# Patient Record
Sex: Female | Born: 1985 | Race: White | Hispanic: No | Marital: Married | Smoking: Former smoker
Health system: Southern US, Community
[De-identification: ages and names within clinical notes are randomized; demographics above are authoritative.]

## PROBLEM LIST (undated history)

## (undated) DIAGNOSIS — F445 Conversion disorder with seizures or convulsions: Secondary | ICD-10-CM

## (undated) DIAGNOSIS — G8929 Other chronic pain: Secondary | ICD-10-CM

## (undated) DIAGNOSIS — R51 Headache: Secondary | ICD-10-CM

## (undated) DIAGNOSIS — Z8632 Personal history of gestational diabetes: Secondary | ICD-10-CM

## (undated) DIAGNOSIS — R519 Headache, unspecified: Secondary | ICD-10-CM

## (undated) DIAGNOSIS — F319 Bipolar disorder, unspecified: Secondary | ICD-10-CM

## (undated) HISTORY — PX: TUBAL LIGATION: SHX77

---
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
## 2011-01-08 ENCOUNTER — Ambulatory Visit: Payer: Self-pay | Admitting: Internal Medicine

## 2011-01-23 ENCOUNTER — Emergency Department: Payer: Self-pay | Admitting: Emergency Medicine

## 2011-02-08 ENCOUNTER — Emergency Department: Payer: Self-pay | Admitting: Emergency Medicine

## 2011-05-15 ENCOUNTER — Observation Stay: Payer: Self-pay | Admitting: Internal Medicine

## 2011-07-02 ENCOUNTER — Observation Stay: Payer: Self-pay

## 2011-12-13 ENCOUNTER — Emergency Department: Payer: Self-pay | Admitting: Emergency Medicine

## 2012-04-08 ENCOUNTER — Ambulatory Visit: Payer: Self-pay

## 2012-11-01 ENCOUNTER — Emergency Department: Payer: Self-pay | Admitting: Emergency Medicine

## 2012-11-01 LAB — URINALYSIS, COMPLETE
Bilirubin,UR: NEGATIVE
Glucose,UR: NEGATIVE mg/dL (ref 0–75)
Ketone: NEGATIVE
Leukocyte Esterase: NEGATIVE
Nitrite: NEGATIVE
Ph: 5 (ref 4.5–8.0)
Protein: NEGATIVE
RBC,UR: 1 /HPF (ref 0–5)
Specific Gravity: 1.024 (ref 1.003–1.030)
Squamous Epithelial: 4
WBC UR: 1 /HPF (ref 0–5)

## 2012-11-01 LAB — BASIC METABOLIC PANEL
Anion Gap: 8 (ref 7–16)
BUN: 8 mg/dL (ref 7–18)
Calcium, Total: 9.2 mg/dL (ref 8.5–10.1)
Chloride: 109 mmol/L — ABNORMAL HIGH (ref 98–107)
Co2: 25 mmol/L (ref 21–32)
Creatinine: 0.85 mg/dL (ref 0.60–1.30)
EGFR (African American): >60
EGFR (Non-African Amer.): >60
Glucose: 102 mg/dL — ABNORMAL HIGH (ref 65–99)
Osmolality: 282 (ref 275–301)
Potassium: 3.9 mmol/L (ref 3.5–5.1)
Sodium: 142 mmol/L (ref 136–145)

## 2012-11-01 LAB — CBC
HCT: 42.4 % (ref 35.0–47.0)
HGB: 15 g/dL (ref 12.0–16.0)
MCH: 30.9 pg (ref 26.0–34.0)
MCHC: 35.4 g/dL (ref 32.0–36.0)
MCV: 87 fL (ref 80–100)
Platelet: 244 10*3/uL (ref 150–440)
RBC: 4.86 10*6/uL (ref 3.80–5.20)
RDW: 12.9 % (ref 11.5–14.5)
WBC: 7 10*3/uL (ref 3.6–11.0)

## 2012-11-03 LAB — URINE CULTURE

## 2013-06-04 ENCOUNTER — Emergency Department: Payer: Self-pay | Admitting: Emergency Medicine

## 2014-10-06 ENCOUNTER — Emergency Department: Payer: Self-pay | Admitting: Emergency Medicine

## 2014-10-06 LAB — COMPREHENSIVE METABOLIC PANEL
Albumin: 4 g/dL (ref 3.4–5.0)
Alkaline Phosphatase: 103 U/L
Anion Gap: 0 — ABNORMAL LOW (ref 7–16)
BUN: 11 mg/dL (ref 7–18)
Bilirubin,Total: 0.6 mg/dL (ref 0.2–1.0)
Calcium, Total: 8.4 mg/dL — ABNORMAL LOW (ref 8.5–10.1)
Chloride: 111 mmol/L — ABNORMAL HIGH (ref 98–107)
Co2: 27 mmol/L (ref 21–32)
Creatinine: 0.78 mg/dL (ref 0.60–1.30)
EGFR (African American): >60
EGFR (Non-African Amer.): >60
Glucose: 110 mg/dL — ABNORMAL HIGH (ref 65–99)
Osmolality: 276 (ref 275–301)
Potassium: 3.6 mmol/L (ref 3.5–5.1)
SGOT(AST): 30 U/L (ref 15–37)
SGPT (ALT): 51 U/L
Sodium: 138 mmol/L (ref 136–145)
Total Protein: 7.5 g/dL (ref 6.4–8.2)

## 2014-10-06 LAB — CBC
HCT: 45.6 % (ref 35.0–47.0)
HGB: 15.1 g/dL (ref 12.0–16.0)
MCH: 29.6 pg (ref 26.0–34.0)
MCHC: 33.1 g/dL (ref 32.0–36.0)
MCV: 89 fL (ref 80–100)
Platelet: 197 10*3/uL (ref 150–440)
RBC: 5.11 10*6/uL (ref 3.80–5.20)
RDW: 12.8 % (ref 11.5–14.5)
WBC: 8.3 10*3/uL (ref 3.6–11.0)

## 2014-10-06 LAB — URINALYSIS, COMPLETE
Bacteria: NONE SEEN
Bilirubin,UR: NEGATIVE
Blood: NEGATIVE
Glucose,UR: NEGATIVE mg/dL (ref 0–75)
Ketone: NEGATIVE
Leukocyte Esterase: NEGATIVE
Nitrite: NEGATIVE
Ph: 5 (ref 4.5–8.0)
Protein: NEGATIVE
RBC,UR: 11 /HPF (ref 0–5)
Specific Gravity: 1.032 (ref 1.003–1.030)
Squamous Epithelial: 23
WBC UR: 3 /HPF (ref 0–5)

## 2014-10-06 LAB — PREGNANCY, URINE: Pregnancy Test, Urine: NEGATIVE m[IU]/mL

## 2014-10-07 LAB — URINE CULTURE

## 2014-12-09 ENCOUNTER — Emergency Department: Payer: Self-pay | Admitting: Emergency Medicine

## 2014-12-09 LAB — URINALYSIS, COMPLETE
Bacteria: NONE SEEN
Bilirubin,UR: NEGATIVE
Blood: NEGATIVE
Glucose,UR: NEGATIVE mg/dL (ref 0–75)
Ketone: NEGATIVE
Leukocyte Esterase: NEGATIVE
Nitrite: NEGATIVE
Ph: 6 (ref 4.5–8.0)
Protein: NEGATIVE
RBC,UR: NONE SEEN /HPF (ref 0–5)
Specific Gravity: 1.021 (ref 1.003–1.030)
Squamous Epithelial: 3
WBC UR: <1 /HPF (ref 0–5)

## 2014-12-09 LAB — INFLUENZA A,B,H1N1 - PCR (ARMC)
H1N1 flu by pcr: NOT DETECTED
Influenza A By PCR: NEGATIVE
Influenza B By PCR: NEGATIVE

## 2014-12-26 ENCOUNTER — Emergency Department: Payer: Self-pay | Admitting: Emergency Medicine

## 2014-12-26 LAB — CBC WITH DIFFERENTIAL/PLATELET
Basophil #: 0.1 10*3/uL (ref 0.0–0.1)
Basophil %: 0.6 %
Eosinophil #: 0.1 10*3/uL (ref 0.0–0.7)
Eosinophil %: 1.5 %
HCT: 39.8 % (ref 35.0–47.0)
HGB: 13.3 g/dL (ref 12.0–16.0)
Lymphocyte #: 2.9 10*3/uL (ref 1.0–3.6)
Lymphocyte %: 35.8 %
MCH: 30.1 pg (ref 26.0–34.0)
MCHC: 33.5 g/dL (ref 32.0–36.0)
MCV: 90 fL (ref 80–100)
Monocyte #: 0.6 x10 3/mm (ref 0.2–0.9)
Monocyte %: 6.7 %
Neutrophil #: 4.6 10*3/uL (ref 1.4–6.5)
Neutrophil %: 55.4 %
Platelet: 232 10*3/uL (ref 150–440)
RBC: 4.43 10*6/uL (ref 3.80–5.20)
RDW: 12.7 % (ref 11.5–14.5)
WBC: 8.2 10*3/uL (ref 3.6–11.0)

## 2014-12-26 LAB — COMPREHENSIVE METABOLIC PANEL
Albumin: 3.8 g/dL (ref 3.4–5.0)
Alkaline Phosphatase: 84 U/L
Anion Gap: 5 — ABNORMAL LOW (ref 7–16)
BUN: 8 mg/dL (ref 7–18)
Bilirubin,Total: 0.2 mg/dL (ref 0.2–1.0)
Calcium, Total: 8.5 mg/dL (ref 8.5–10.1)
Chloride: 107 mmol/L (ref 98–107)
Co2: 26 mmol/L (ref 21–32)
Creatinine: 0.85 mg/dL (ref 0.60–1.30)
EGFR (African American): >60
EGFR (Non-African Amer.): >60
Glucose: 112 mg/dL — ABNORMAL HIGH (ref 65–99)
Osmolality: 275 (ref 275–301)
Potassium: 3.9 mmol/L (ref 3.5–5.1)
SGOT(AST): 16 U/L (ref 15–37)
SGPT (ALT): 25 U/L
Sodium: 138 mmol/L (ref 136–145)
Total Protein: 7.2 g/dL (ref 6.4–8.2)

## 2014-12-26 LAB — URINALYSIS, COMPLETE
Bacteria: NONE SEEN
Bilirubin,UR: NEGATIVE
Blood: NEGATIVE
Glucose,UR: NEGATIVE mg/dL (ref 0–75)
Ketone: NEGATIVE
Leukocyte Esterase: NEGATIVE
Nitrite: NEGATIVE
Ph: 6 (ref 4.5–8.0)
Protein: NEGATIVE
RBC,UR: <1 /HPF (ref 0–5)
Specific Gravity: 1.004 (ref 1.003–1.030)
Squamous Epithelial: 6
WBC UR: 2 /HPF (ref 0–5)

## 2014-12-26 LAB — HCG, QUANTITATIVE, PREGNANCY: Beta Hcg, Quant.: 120 m[IU]/mL — ABNORMAL HIGH

## 2015-02-21 ENCOUNTER — Observation Stay: Payer: Self-pay | Admitting: Internal Medicine

## 2015-02-22 ENCOUNTER — Ambulatory Visit: Payer: Self-pay | Admitting: Neurology

## 2015-02-24 ENCOUNTER — Emergency Department: Payer: Self-pay | Admitting: Emergency Medicine

## 2015-04-25 NOTE — Consult Note (Signed)
Present Illness Pt with no cardiac history but history of a syncope times two over the past 24 hours. She is [redacted] weeks pregnant with her third child. She was bowling with her family and felt very hot and lightheaded. She went outside and felt somewhat better. She came back in and relt lightheaded again followed by a syncopal episode. She was felt to not have a pulse by bystanders. Her husband brought her to the hospital and she aparently had another syncopal episode in the car on the way. In the er her ekg was normal. Her labs were normal. She had an echo which revealed normal lv fucntion with no valvular abnormalities. She is hemodynamically stable. WHile in the er, she was being prepared for discharge when she was noted to have a pulse ox in the 70s with associated chest heaviness. No report of arrythmia with this event. She has not been noted ot be hypoxic in the past. Since admission she is stable with no dizziness or syncope. No eviidence of arrythmia. She had preeclampsia with her first child and gestational diabetes with her second. No arrythmia   Physical Exam:  GEN no acute distress   HEENT PERRL   NECK supple   RESP normal resp effort  no use of accessory muscles   CARD Normal, S1, S2   ABD denies tenderness  normal BS  no Adominal Mass   LYMPH negative neck, negative axillae   EXTR negative cyanosis/clubbing, negative edema   SKIN normal to palpation, No rashes   NEURO cranial nerves intact, motor/sensory function intact   PSYCH A+O to time, place, person   Review of Systems:  Subjective/Chief Complaint two episodes of syncope   General: No Complaints   Skin: No Complaints   ENT: No Complaints   Eyes: No Complaints   Neck: No Complaints   Respiratory: No Complaints   Cardiovascular: No Complaints   Gastrointestinal: No Complaints   Genitourinary: No Complaints   Vascular: No Complaints   Musculoskeletal: No Complaints   Neurologic: Fainting    Hematologic: No Complaints   Endocrine: No Complaints   Psychiatric: No Complaints   Review of Systems: All other systems were reviewed and found to be negative   Medications/Allergies Reviewed Medications/Allergies reviewed   Family & Social History:  Family and Social History:  Family History Non-Contributory   Social History negative tobacco   Place of Living Home   EKG:  EKG NSR    Hydrocodone: Rash  Latex: Hives   Impression Female with no cardiac history admitted after suffering two syncoopal episodes. SHe is [redacted] weeks pregnant with her third child. Had preecclampsia with her first child and gesttionl diabetes with a second No synope with these births and the children are normal. The episodes had features consisant with vasovagal syncope. There was an episode in the er with apparent hypoxia noted on pulse ox. No arrythmia reported at the time. Etiology of this is unclear. Pulse ox not reading well vs true hypoxia. Echo did not reveal any signficant abnormalities. Will need aggitated saline contrast echo to evaluate for pfo. Will follow for arrythmia on telemetry and follow electrolytes and porceed with contrast echo in am. CXR was normal as was carotid doppler   Plan 1. Hydration 2 Follow on tele 3. Agitate saline contast echo in am 4. Follow electrolytes   Electronic Signatures: Dalia HeadingFath, Kert Shackett A (MD)  (Signed 28-Feb-16 17:20)  Authored: General Aspect/Present Illness, History and Physical Exam, Review of System, Family & Social History,  EKG , Allergies, Impression/Plan   Last Updated: 28-Feb-16 17:20 by Dalia Heading (MD)

## 2015-04-25 NOTE — Consult Note (Signed)
PATIENT NAME:  Salvatore DecentBAKER DAVIS, Ronie M MR#:  [id number] DATE OF BIRTH:  1986-04-23  DATE OF CONSULTATION:  02/22/2015  REFERRING PHYSICIAN:   CONSULTING PHYSICIAN:  Pauletta BrownsYuriy Gennaro Lizotte, MD  REASON FOR CONSULTATION: Syncopal event.   HISTORY OF PRESENT ILLNESS: This is a 29 year old female with pregnancy. She is currently 3713 weeks gestational age. Presented with multiple syncopal episodes, described as x2. The patient states prior to the episodes the patient felt very hot and lightheaded. That was during a bowling episode. The second syncopal episode occurred while the patient was in the vehicle driving. No seizure like activity. No tongue biting. No urinary incontinence. No history of seizures in the past. No family history of seizures. The patient currently states she is still dizzy. On current blood pressure checks, the patient's blood pressure is 90 systolic.   HOME MEDICATIONS: Reviewed.   IMAGING: Reviewed.   DIAGNOSTIC DATA: Laboratory workup has been reviewed.   NEUROLOGIC EXAMINATION: The patient states she is dizzy. Extraocular movements intact. Facial sensation intact. Facial motor is intact. Tongue is in the midline. Uvula elevates symmetrically. Shoulder shrug intact. Motor 5/5 in bilateral upper and lower extremities. Coordination: Finger-to-nose intact. Sensation intact. Coordination of finger-to-nose intact.   REVIEW OF SYSTEMS: No shortness of breath. Positive chest pain that has resolved. No weakness on one side of the body compared to the other. Positive history for syncope. Pregnancy positive history.  ASSESSMENT: A 29 year old female, who is [redacted] weeks pregnant with her third child, with apparent history of eclampsia in the past, presents with multiple syncopal episodes, shortness of breath, now complains of dizziness. Work-up underway.   PLAN: Continue telemetry. Appreciate cardiology evaluation with echocardiogram and bubble study to be performed in the a.m. Hydration. No further  imaging from a neurological standpoint. This is not seizure activity. No need for EEG monitoring. Please call with any questions.   ____________________________ Pauletta BrownsYuriy Ferry Matthis, MD yz:sb D: 02/22/2015 10:14:36 ET T: 02/22/2015 11:03:32 ET JOB#: [id number]  cc: Pauletta BrownsYuriy Blyss Lugar, MD, <Dictator> Pauletta BrownsYURIY Jakeline Dave MD ELECTRONICALLY SIGNED 03/11/2015 12:09

## 2015-04-25 NOTE — Discharge Summary (Signed)
PATIENT NAME:  Heidi Hudson, Heidi Hudson MR#:  [id number] DATE OF BIRTH:  January 01, 1986  DATE OF ADMISSION:  02/21/2015 DATE OF DISCHARGE:  02/22/2015  ADMITTING DIAGNOSIS: Syncope.   DISCHARGE DIAGNOSES: 1. Syncope versus presyncope, suspected orthostatic hypotension related.  2. Orthostatic hypotension.  3. Dehydration.  4. Dizziness due to orthostatic hypotension.  5. Hyperglycemia with hemoglobin A1c 5.1. No diabetes.  6. Hypoxia, intermittent, resolved, questionable obstructive sleep apnea. Sleep study was recommended.  7. Hypokalemia, resolved.  8. Urinary tract infection, Escherichia coli per report from [facility].  9. A 13 week pregnancy. Per report from confirmed Doppler, fetal heart rate of 152.  MEDICATIONS: Meclizine 12.5 mg p.o. every 8 hours as needed. Keflex 500 mg every 8 hours, started on the 29th of February 2016.   HOME OXYGEN: None.   DIET: Regular, regular consistency. The patient was advised to drink plenty of fluids to hydrate herself.   FOLLOWUP: Follow-up appointment at [facility] with Ermalene Searing on Thursday, in 3 days at 2:00 p.m. in her office.   CONSULTANTS: Care management, social work, neurologist Dr. Loretha Brasil, cardiologist Dr. Lady Gary, GYN specialist Dr. Thomasene Mohair.   RADIOLOGIC STUDIES: Chest x-ray portable single view, on the 28th of Febreuary 2016 showed no active disease. CTA of chest, withIV contrast to rule out pulmonary embolism on 28th of February 2016 revealing no pulmonary embolus or acute intrathoracic process. Carotid ultrasound 28th of February 2016 showing normal bilateral carotid duplex ultrasound with normal antegrade flow in the right as well as left vertebral arteries. Echocardiogram 28th of February 2016 showed left ventricular ejection fraction by visual estimation 60% -65%, normal global left ventricular systolic function. Echocardiogram was agitated saline contrast. Bubble study showed left ventricular ejection fraction by visual estimation 60% -65%.  Saline contrast bubble study was negative with no evidence of any intracardiac shunt,  intact intra-atrial as well as intraventricular septa. No echocardiographic evidence of intracardiac shunting.   HISTORY OF PRESENT ILLNESS: The patient 29 year old Caucasian female with past medical history significant for 2 pregnancies with complications which resulted in life and healthy children, approximately 6, as well as 3 years ago, who presents to the hospital with episode of syncope. Please refer to Dr. Elias Else admission note and 28th of February 2016. On arrival to the hospital, the patient stated that she was bowling with her husband and friends and she felt weak. She sat down since she felt as if she was going to pass out. She had some nausea. She was able to gather strength and was able to walk to the car and apparently did not lose consciousness while walking. However, on the way home, she appeared to lose consciousness. Apparently  of her companions checked for pulses as well as respirations and they could not detect either, which prompted them to bring her to the Emergency Room. In the Emergency Room, she was very somnolent, difficult to arouse, but did awaken to sternal rub and she had initial altered mental status. Later on she appeared to be more alert, but she did have desaturations to 80% on room air when she got up to go to the commode. She also apparently, according to family members, had episodes of low blood pressure in the Emergency Room. On arrival to the Emergency Room documented vital signs, temperature was 98, pulse was 74, respirations was 18, blood pressure 102/66, saturation was 94% on room air.   PHYSICAL EXAM: Unremarkable.   LABORATORY DATA: Done on arrival to the Emergency Room showed mildly elevated glucose to  114; otherwise BMP was unremarkable. The patient's hemoglobin A1c was checked and was found to be 5.1, lipase 158, magnesium 2.1. Alcohol level less than 3. HCG beta subunit was  43,969. The patient's liver enzymes revealed albumin level of 3.2; otherwise, unremarkable. Cardiac enzymes x4 were within normal limits. TSH was normal at 2.45. Urine drug screen was negative. CBC was within normal limits. D-dimer was 359. Urinalysis was remarkable for less than 1 red blood cell, 1 white blood cell, 1+ bacteria, 14 epithelial cells. Otherwise negative for protein, nitrites or leukocyte esterase. Mucus was present. The patient's acetominophin level was less than 2, and salicylate level was less than 1.7. ABGs were performed and showed pH of 7.54, pCO2 was 25, pO2 was 154, saturation was 99.2% with bicarbonate level of 21.4. Lactic acid level was elevated at 1.7.   EKG showed normal sinus rhythm with sinus arrhythmia at 78 beats per minute, normal axis, no acute ST changes were noted. The patient's chest x-ray was unremarkable.   HOSPITAL COURSE: The patient was admitted to the hospital for further evaluation. GYN evaluation by Dr. Jean RosenthalJackson was obtained. Dr. Jean RosenthalJackson saw the patient in conslutation the seen day as on admission. He recommended [facility]. If no record of viable intrauterine pregnancy, recommended then to obtain pelvic ultrasound to verify dating and viability of pregnancy, as this may effect treatment decisions. Recommended to continue prenatal vitamins, folic acid and he stated that he would make OB recommendations as needed. In regards to  hypoxemia, he recommended to get CTA or VQ scan and discussed that with physicians. The patient underwent CTA of her chest, which showed no pulmonary embolism or no thoracic aortic dissection or abnormally narrowed artery, such as subclavian narrowing was not detected. The patient was admitted to the hospital. Her cardiac enzymes were cycled and cardiology work-up was initiated. Cardiologist, Dr. Lady GaryFath, saw the patient in consultation the same day, 28th of February 2016. Dr. Lady GaryFath felt that the patient should be followed for  arrhythmia on telemetry and hydrate her and get agitated saline contrast echocardiogram in the morning. The patient's orthostatic vital signs were checked and were found to be abnormal. On arrival to the hospital, 28th of February  2016, at around 10:30 a.m. The patient's orthostatic vital signs revealed pulse increased from a laying position of 78 to a standing position of 97. Her blood pressure, however, did not drop down significantly from 115 to 110 systolic. The patient was initiated on IV fluids. However, with IV fluid administration, today on the 29th of February 2016, she was noted to have more pronounced orthostatic hypotension with systolic blood pressure dropping down from 118 to 98 and heart rate going up from 72 to 103 from lying position to standing position.  She was given IV fluid bolus and her blood pressure was checked on the right as well as on the left arm, and differences were noted in her left arm as well as right arm results. The patient's left arm revealed a lower systolic blood pressure in the 80s over 40s; however, the patient's right arm showed systolic blood pressure 115/60s, which could signify her blood pressure reading changes. The patient was evaluated by Dr. Loretha BrasilZeylikman while in the hospital, although the patient denies ever seeing him.  Dr. Loretha BrasilZeylikman very clearly documented neurologic examination. He   recommended to continue telemetry, cardiology evaluation with echocardiogram as well as bubble study; however, no further imaging from neurologic standpoint he recommended, since he does not feel  that this was seizure activity, and there was no need for electroencephalogram monitoring. The patient had echocardiogram with agitated saline done, and it was negative for intracardiac shunts. It was felt the patient is relatively stable to be discharged to home; however, states she still continued to have to some feeling of dizziness, as well as feeling presyncopal whenever she stood up, and  she was not able to walk too much. I contacted [facility] GYN physician as well as internal medicine physicians and discussed the patient's case and the patient will be sent to [facility] to Dr. Morton Stall 's service which we truly appreciate. We feel that the patient's blood pressure fluctuations could result of urinary tract infection as were made aware of the urinary tract infection  just recently. We initiated the patient on Keflex; however, results of urine cultures were not available for [place] at this point. It is recommended to follow the patient's urine cultures very closely to make decisions about antibiotic selection depending on the identification and sensitivity results. On the day of discharge, 29th of February 2016, the patient's vital signs, temperature was 97.8, pulse was 84 to 92, respiratory rate 18, blood pressure 120 while she was laying down and 107 whenever she stood up. Heart rate was 84 whenever she is lying down and 97 whenever she stood up. O2 saturations were 96% on room air on exertion as well as at rest.   TIME SPENT: 40 minutes with the patient.   ____________________________ Katharina Caper, MD rv:dw D: 02/22/2015 17:55:04 ET T: 02/22/2015 20:01:01 ET JOB#: [id number]  cc: Katharina Caper, MD, <Dictator> Ms. Ermalene Searing Kaylin Schellenberg MD ELECTRONICALLY SIGNED 03/10/2015 13:37

## 2015-04-25 NOTE — H&P (Signed)
PATIENT NAME:  Heidi Hudson, Heidi Hudson MR#:  [id number] DATE OF BIRTH:  1986-08-17  DATE OF ADMISSION:  02/21/2015  REFERRING PHYSICIAN:  [place] Sink. Dolores Frame, MD   PRIMARY CARE PHYSICIAN:  Nonlocal.   ADMISSION DIAGNOSIS:  Syncope and hypoxia.   HISTORY OF PRESENT ILLNESS:  This is a 29 year old Caucasian female who presents to the Emergency Department after an episode of syncope. The patient is currently 41 weeks' pregnant and had been bowling with her husband and friends when she felt weak. The patient sat down when she said that she felt like she might pass out. At that time, she admits to nausea. She seemed to gather her strength and decided to walk out to the car, where she needed the physical support of her acquaintances to ambulate to the vehicle. She did not have any loss of consciousness while walking, but while in the car on the way home, she appeared to lose consciousness. One of her companions checked for a pulse or respirations and could not detect either, which prompted them to come to the Emergency Department. Upon arrival, the patient was very somnolent and difficult to arouse, but did awaken to sternal rub. After this initial period of altered mental status, she appeared to become more alert but had a desaturation to 80% on room air when she got up from the bed to go to the commode. This was thought to be an anomaly or perhaps malfunction of the monitor equipment, but she was observed to have another desaturation to as low as 70 while awake. Plethysmography was not documented during this episode, but it was at least verbally reported to have been detecting decent waveform. Notably, the patient states that she has also had some chest pain. It has lasted for hours at the time of my physical examination and the patient states was 9 out of 10 in severity. She states that the pain was more of a tightness like a band around her chest that seemed to worsen when she had the periods of desaturation. Her  pain is better now, but still not completely gone. Due to the vague nature of her complaints and surprising findings in the Emergency Department, the Emergency Department staff called for admission.   REVIEW OF SYSTEMS: CONSTITUTIONAL:  The patient denies fever, but admits to some weakness.  EYES:  Denies blurred vision or inflammation.  EARS, NOSE, AND THROAT:  Denies tinnitus or sore throat.  RESPIRATORY:  Denies shortness of breath or cough.  CARDIOVASCULAR:  Admits to chest pain but denies palpitations, orthopnea, or paroxysmal nocturnal dyspnea.  GASTROINTESTINAL:  Denies nausea or vomiting.  GENITOURINARY:  Denies dysuria, increased frequency, or hesitancy of urination.  ENDOCRINE:  Denies polyuria or polydipsia.  HEMATOLOGIC AND LYMPHATIC:  Denies easy bruising or bleeding.  INTEGUMENTARY:  Denies rashes or lesions.  MUSCULOSKELETAL:  Denies arthralgias or myalgias.  NEUROLOGIC:  Denies numbness in her extremities or difficulty speaking.  PSYCHIATRIC:  Denies depression or suicidal ideation.   PAST MEDICAL HISTORY:  None.   PAST SURGICAL HISTORY:  The patient has had 2 C-sections.   SOCIAL HISTORY:  The patient is married and lives with her husband. She does not smoke, drink, or do any drugs.   FAMILY HISTORY:  Significant for coronary artery disease and diabetes as well as lupus in her grandmother and aunts.   MEDICATIONS:  None.   ALLERGIES:  HYDROCODONE AND LATEX.   PERTINENT LABORATORY DATA AND RADIOGRAPHIC FINDINGS:  Serum glucose is 114, BUN 6, creatinine  0.64, serum sodium 137, potassium 3.4, chloride 105, bicarbonate 24, calcium 8.7, magnesium 2.1, and lipase is 158. Hemoglobin A1c is 5.1. Ethanol is less than 3. Serum albumin is 3.2, alkaline phosphatase 80, AST 20, and ALT is 14. Troponin is negative. Thyroid stimulating hormone is within normal limits. Urine drug screen is negative. White blood cell count is 9.8, hemoglobin 13.2, hematocrit 39.4, platelet count 233,000.  MCV is 89. D-dimer is negative. Urinalysis is negative for infection. Acetaminophen and salicylate level are below detection limit. ABG shows a pH of 7.54, pCO2 of 25, and pO2 of 154 on 28% FiO2 with a base excess of 0. Chest x-ray shows no active disease. CTA of the chest shows no pulmonary embolus or acute intrathoracic process. An ultrasound of the carotids shows normal bilateral duplex ultrasound.   PHYSICAL EXAMINATION: VITAL SIGNS:  Temperature is 98, pulse 74, respirations 18, blood pressure 102/66, and pulse oximetry is 94% on room air.  GENERAL:  The patient is alert and oriented x 3 and in no apparent distress.  HEENT:  Normocephalic, atraumatic. Pupils are equal, round, and reactive to light and accommodation. Extraocular movements are intact. Mucous membranes are moist.  NECK:  Trachea is midline. There is no adenopathy. Thyroid is nonpalpable and nontender.  CHEST:  Symmetric and atraumatic.  CARDIOVASCULAR:  Regular rate and rhythm. Normal S1 and S2. No rubs, clicks, or murmurs appreciated.  LUNGS:  Clear to auscultation bilaterally. Normal effort and excursion.  ABDOMEN:  Positive bowel sounds. Soft, nontender, nondistended. No hepatosplenomegaly.  GENITOURINARY:  Deferred.  MUSCULOSKELETAL:  The patient moves all 4 extremities equally. She is able to walk to and from the bed in the Emergency Department without difficulty.  EXTREMITIES:  No clubbing, cyanosis, or edema.  SKIN:  Warm and dry. There are no rashes or lesions.  NEUROLOGIC:  Cranial nerves II through XII are grossly intact.  PSYCHIATRIC:  Mood is normal. Affect is congruent.   ASSESSMENT AND PLAN:  This is a 29 year old female admitted for syncope and hypoxia.   1.  Syncope.  The patient has had a brief episode during which she was unarousable. She reportedly did not have detectable vital signs; however, it is important to note that upon arrival to the Emergency Department, the patient was aroused by sternal rub. Of  course, this does not rule out cardiopulmonary collapse or neurogenic syncope; however, it certainly makes it less likely as there was no intervention in the time the patient was not under medical care. She has not had any episodes of syncope or near syncope in the Emergency Department. Differential diagnosis is broad. Due to the patient's ongoing chest pain, we have had an extensive discussion with our obstetric consultant to determine the safety and utility of various methods to rule out pulmonary embolism. Needless to say, with their input, we have now obtained a CTA of the chest, which is negative for pulmonary embolism. We will obtain a cardiology consult for further guidance.  2.  Hypoxia. The patient's lowest desaturation was 70. Her ABG shows a mild alkalosis, which is common for pregnancy. Review of current OB physiology corroborates that mild alkalosis can be seen in the first trimester of pregnancy, thus this does not appear to be the etiology of her syncope. Notably, she is reported to have been tachypneic or hyperventilating in these episodes of hypoxia witnessed here in the hospital, but on physical examination and during our conversation, the patient's respiratory effort and rate was totally normal. At this time,  the etiology of her hypoxia is unclear and we will seek other methods to clarify what may be occurring with the patient.  3.  Deep vein thrombosis prophylaxis. Heparin.  4.  Gastrointestinal prophylaxis:  None.   CODE STATUS:  The patient is a full code.   TIME SPENT ON ADMISSION ORDERS AND PATIENT CARE:  Approximately 35 minutes.     ____________________________ Kelton PillarMichael S. Sheryle Hailiamond, MD msd:nb D: 02/22/2015 06:25:30 ET T: 02/22/2015 06:56:59 ET JOB#: [id number]  cc: Kelton PillarMichael S. Sheryle Hailiamond, MD, <Dictator> Kelton PillarMICHAEL S Mita Vallo MD ELECTRONICALLY SIGNED 02/25/2015 5:04

## 2016-02-05 ENCOUNTER — Ambulatory Visit
Admission: EM | Admit: 2016-02-05 | Discharge: 2016-02-05 | Disposition: A | Discharge status: Home / Self Care | Payer: Medicaid Other | Attending: Family Medicine | Admitting: Family Medicine

## 2016-02-05 DIAGNOSIS — R05 Cough: Secondary | ICD-10-CM | POA: Diagnosis present

## 2016-02-05 DIAGNOSIS — J069 Acute upper respiratory infection, unspecified: Secondary | ICD-10-CM

## 2016-02-05 LAB — RAPID INFLUENZA A&B ANTIGENS
Influenza A (ARMC): NOT DETECTED
Influenza B (ARMC): NOT DETECTED

## 2016-02-05 LAB — RAPID STREP SCREEN (MED CTR MEBANE ONLY): Streptococcus, Group A Screen (Direct): NEGATIVE

## 2016-02-05 MED ORDER — BENZONATATE 200 MG PO CAPS: 200.0000 mg | ORAL_CAPSULE | Freq: Three times a day (TID) | ORAL | Status: DC | PRN

## 2016-02-05 NOTE — Discharge Instructions (Signed)
Cool Mist Vaporizers °Vaporizers may help relieve the symptoms of a cough and cold. They add moisture to the air, which helps mucus to become thinner and less sticky. This makes it easier to breathe and cough up secretions. Cool mist vaporizers do not cause serious burns like hot mist vaporizers, which may also be called steamers or humidifiers. Vaporizers have not been proven to help with colds. You should not use a vaporizer if you are allergic to mold. °HOME CARE INSTRUCTIONS °· Follow the package instructions for the vaporizer. °· Do not use anything other than distilled water in the vaporizer. °· Do not run the vaporizer all of the time. This can cause mold or bacteria to grow in the vaporizer. °· Clean the vaporizer after each time it is used. °· Clean and dry the vaporizer well before storing it. °· Stop using the vaporizer if worsening respiratory symptoms develop. °  °This information is not intended to replace advice given to you by your health care provider. Make sure you discuss any questions you have with your health care provider. °  °Document Released: 09/07/2004 Document Revised: 12/16/2013 Document Reviewed: 04/30/2013 °Elsevier Interactive Patient Education ©2016 Elsevier Inc[employer] ° °Upper Respiratory Infection, Adult °Most upper respiratory infections (URIs) are a viral infection of the air passages leading to the lungs. A URI affects the nose, throat, and upper air passages. The most common type of URI is nasopharyngitis and is typically referred to as "the common cold." °URIs run their course and usually go away on their own. Most of the time, a URI does not require medical attention, but sometimes a bacterial infection in the upper airways can follow a viral infection. This is called a secondary infection. Sinus and middle ear infections are common types of secondary upper respiratory infections. °Bacterial pneumonia can also complicate a URI. A URI can worsen asthma and chronic obstructive  pulmonary disease (COPD). Sometimes, these complications can require emergency medical care and may be life threatening.  °CAUSES °Almost all URIs are caused by viruses. A virus is a type of germ and can spread from one person to another.  °RISKS FACTORS °You may be at risk for a URI if:  °· You smoke.   °· You have chronic heart or lung disease. °· You have a weakened defense (immune) system.   °· You are very young or very old.   °· You have nasal allergies or asthma. °· You work in crowded or poorly ventilated areas. °· You work in health care facilities or schools. °SIGNS AND SYMPTOMS  °Symptoms typically develop 2-3 days after you come in contact with a cold virus. Most viral URIs last 7-10 days. However, viral URIs from the influenza virus (flu virus) can last 14-18 days and are typically more severe. Symptoms may include:  °· Runny or stuffy (congested) nose.   °· Sneezing.   °· Cough.   °· Sore throat.   °· Headache.   °· Fatigue.   °· Fever.   °· Loss of appetite.   °· Pain in your forehead, behind your eyes, and over your cheekbones (sinus pain). °· Muscle aches.   °DIAGNOSIS  °Your health care provider may diagnose a URI by: °· Physical exam. °· Tests to check that your symptoms are not due to another condition such as: °¨ Strep throat. °¨ Sinusitis. °¨ Pneumonia. °¨ Asthma. °TREATMENT  °A URI goes away on its own with time. It cannot be cured with medicines, but medicines may be prescribed or recommended to relieve symptoms. Medicines may help: °· Reduce your fever. °· Reduce   your cough. °· Relieve nasal congestion. °HOME CARE INSTRUCTIONS  °· Take medicines only as directed by your health care provider.   °· Gargle warm saltwater or take cough drops to comfort your throat as directed by your health care provider. °· Use a warm mist humidifier or inhale steam from a shower to increase air moisture. This may make it easier to breathe. °· Drink enough fluid to keep your urine clear or pale yellow.   °· Eat  soups and other clear broths and maintain good nutrition.   °· Rest as needed.   °· Return to work when your temperature has returned to normal or as your health care provider advises. You may need to stay home longer to avoid infecting others. You can also use a face mask and careful hand washing to prevent spread of the virus. °· Increase the usage of your inhaler if you have asthma.   °· Do not use any tobacco products, including cigarettes, chewing tobacco, or electronic cigarettes. If you need help quitting, ask your health care provider. °PREVENTION  °The best way to protect yourself from getting a cold is to practice good hygiene.  °· Avoid oral or hand contact with people with cold symptoms.   °· Wash your hands often if contact occurs.   °There is no clear evidence that vitamin C, vitamin E, echinacea, or exercise reduces the chance of developing a cold. However, it is always recommended to get plenty of rest, exercise, and practice good nutrition.  °SEEK MEDICAL CARE IF:  °· You are getting worse rather than better.   °· Your symptoms are not controlled by medicine.   °· You have chills. °· You have worsening shortness of breath. °· You have brown or red mucus. °· You have yellow or brown nasal discharge. °· You have pain in your face, especially when you bend forward. °· You have a fever. °· You have swollen neck glands. °· You have pain while swallowing. °· You have white areas in the back of your throat. °SEEK IMMEDIATE MEDICAL CARE IF:  °· You have severe or persistent: °¨ Headache. °¨ Ear pain. °¨ Sinus pain. °¨ Chest pain. °· You have chronic lung disease and any of the following: °¨ Wheezing. °¨ Prolonged cough. °¨ Coughing up blood. °¨ A change in your usual mucus. °· You have a stiff neck. °· You have changes in your: °¨ Vision. °¨ Hearing. °¨ Thinking. °¨ Mood. °MAKE SURE YOU:  °· Understand these instructions. °· Will watch your condition. °· Will get help right away if you are not doing well or  get worse. °  °This information is not intended to replace advice given to you by your health care provider. Make sure you discuss any questions you have with your health care provider. °  °Document Released: 06/06/2001 Document Revised: 04/27/2015 Document Reviewed: 03/18/2014 °Elsevier Interactive Patient Education ©2016 Elsevier Inc. ° °

## 2016-02-05 NOTE — ED Provider Notes (Signed)
CSN: [id number]     Arrival date & time 02/05/16  0810 History   First MD Initiated Contact with Patient 02/05/16 [id number]     Chief Complaint  Patient presents with  . Cough    cough, fever, body aches, scratchy throat x 3 days   (Consider location/radiation/quality/duration/timing/severity/associated sxs/prior Treatment) HPI   This a 30 year old female who presents with a three-day history of flu type symptoms including a very sore throat and nonproductive cough. She states she had a fever yesterday of 101.5. She did not have her shot this year. Almost all her family at home have been sick with his sons having an ammonia.      Past Medical History  Diagnosis Date  . Gestational diabetes    Past Surgical History  Procedure Laterality Date  . Cesarean section     Family History  Problem Relation Age of Onset  . Hypertension Mother    Social History  Substance Use Topics  . Smoking status: Former Games developer  . Smokeless tobacco: None  . Alcohol Use: No   OB History    No data available     Review of Systems  Constitutional: Positive for fever, chills, activity change and fatigue.  HENT: Positive for congestion, postnasal drip, sinus pressure and sore throat.   Respiratory: Positive for cough. Negative for shortness of breath.   All other systems reviewed and are negative.   Allergies  Hydrocodone and Latex  Home Medications   Prior to Admission medications   Medication Sig Start Date End Date Taking? Authorizing Provider  benzonatate (TESSALON) 200 MG capsule Take 1 capsule (200 mg total) by mouth 3 (three) times daily as needed for cough. 02/05/16   Lutricia Feil, PA-C   Meds Ordered and Administered this Visit  Medications - No data to display  BP 124/84 mmHg  Pulse 64  Temp(Src) 98 F (36.7 C) (Oral)  Resp 18  Ht 5' 2.5" (1.588 m)  Wt 198 lb (89.812 kg)  BMI 35.62 kg/m2  SpO2 99%  LMP 01/15/2016 No data found.   Physical Exam  Constitutional: She is  oriented to person, place, and time. She appears well-developed and well-nourished. No distress.  HENT:  Head: Normocephalic and atraumatic.  Right Ear: External ear normal.  Left Ear: External ear normal.  Nose: Nose normal.  Mouth/Throat: Oropharynx is clear and moist. No oropharyngeal exudate.  Eyes: Conjunctivae are normal. Pupils are equal, round, and reactive to light.  Neck: Normal range of motion. Neck supple.  Pulmonary/Chest: Effort normal and breath sounds normal. No respiratory distress. She has no wheezes. She has no rales.  Musculoskeletal: Normal range of motion. She exhibits no edema or tenderness.  Neurological: She is alert and oriented to person, place, and time.  Skin: Skin is warm and dry. She is not diaphoretic.  Psychiatric: She has a normal mood and affect. Her behavior is normal. Judgment and thought content normal.  Nursing note and vitals reviewed.   ED Course  Procedures (including critical care time)  Labs Review Labs Reviewed  RAPID INFLUENZA A&B ANTIGENS ([facility] ONLY)  RAPID STREP SCREEN (NOT AT [facility])  CULTURE, GROUP A STREP [facility])    Imaging Review No results found.   Visual Acuity Review  Right Eye Distance:   Left Eye Distance:   Bilateral Distance:    Right Eye Near:   Left Eye Near:    Bilateral Near:         MDM   1. Acute URI  New Prescriptions   BENZONATATE (TESSALON) 200 MG CAPSULE    Take 1 capsule (200 mg total) by mouth 3 (three) times daily as needed for cough.  Plan: 1. Test/x-ray results and diagnosis reviewed with patient 2. rx as per orders; risks, benefits, potential side effects reviewed with patient 3. Recommend supportive treatment with fluids and rest. I will provide her with Jerilynn Som that she is allergic to hydrocodone for cough control. We'll give her a work note for today and tomorrow to return to work on Monday. She's not improving she should follow-up with her primary care physician. He'll call in  48 hours for results of the throat swab  4. F/u prn if symptoms worsen or don't improve     Lutricia Feil, PA-C 02/05/16 4098

## 2016-02-07 LAB — CULTURE, GROUP A STREP (THRC)

## 2016-02-11 ENCOUNTER — Ambulatory Visit
Admit: 2016-02-11 | Discharge: 2016-02-11 | Disposition: A | Discharge status: Home / Self Care | Payer: Medicaid Other | Attending: Emergency Medicine | Admitting: Emergency Medicine

## 2016-02-11 ENCOUNTER — Ambulatory Visit
Admission: EM | Admit: 2016-02-11 | Discharge: 2016-02-11 | Disposition: A | Discharge status: Home / Self Care | Payer: Medicaid Other | Attending: Family Medicine | Admitting: Family Medicine

## 2016-02-11 DIAGNOSIS — R1031 Right lower quadrant pain: Secondary | ICD-10-CM | POA: Insufficient documentation

## 2016-02-11 DIAGNOSIS — R109 Unspecified abdominal pain: Secondary | ICD-10-CM | POA: Diagnosis present

## 2016-02-11 LAB — COMPREHENSIVE METABOLIC PANEL
ALT: 26 U/L (ref 14–54)
AST: 20 U/L (ref 15–41)
Albumin: 4.3 g/dL (ref 3.5–5.0)
Alkaline Phosphatase: 91 U/L (ref 38–126)
Anion gap: 6 (ref 5–15)
BUN: 10 mg/dL (ref 6–20)
CO2: 28 mmol/L (ref 22–32)
Calcium: 8.9 mg/dL (ref 8.9–10.3)
Chloride: 102 mmol/L (ref 101–111)
Creatinine, Ser: 0.74 mg/dL (ref 0.44–1.00)
GFR calc Af Amer: >60 mL/min (ref >60)
GFR calc non Af Amer: >60 mL/min (ref >60)
Glucose, Bld: 103 mg/dL — ABNORMAL HIGH (ref 65–99)
Potassium: 3.6 mmol/L (ref 3.5–5.1)
Sodium: 136 mmol/L (ref 135–145)
Total Bilirubin: 1.1 mg/dL (ref 0.3–1.2)
Total Protein: 7.7 g/dL (ref 6.5–8.1)

## 2016-02-11 LAB — URINALYSIS COMPLETE WITH MICROSCOPIC (ARMC ONLY)
Glucose, UA: NEGATIVE mg/dL
Ketones, ur: NEGATIVE mg/dL
Leukocytes, UA: NEGATIVE
Nitrite: NEGATIVE
Protein, ur: 30 mg/dL — AB
Specific Gravity, Urine: >1.03 — ABNORMAL HIGH (ref 1.005–1.030)
pH: 5.5 (ref 5.0–8.0)

## 2016-02-11 LAB — CBC WITH DIFFERENTIAL/PLATELET
Basophils Absolute: 0 10*3/uL (ref 0–0.1)
Basophils Relative: 1 %
Eosinophils Absolute: 0 10*3/uL (ref 0–0.7)
Eosinophils Relative: 0 %
HCT: 41.7 % (ref 35.0–47.0)
Hemoglobin: 14.4 g/dL (ref 12.0–16.0)
Lymphocytes Relative: 26 %
Lymphs Abs: 1.8 10*3/uL (ref 1.0–3.6)
MCH: 29.3 pg (ref 26.0–34.0)
MCHC: 34.4 g/dL (ref 32.0–36.0)
MCV: 85.2 fL (ref 80.0–100.0)
Monocytes Absolute: 0.4 10*3/uL (ref 0.2–0.9)
Monocytes Relative: 6 %
Neutro Abs: 4.6 10*3/uL (ref 1.4–6.5)
Neutrophils Relative %: 67 %
Platelets: 204 10*3/uL (ref 150–440)
RBC: 4.89 MIL/uL (ref 3.80–5.20)
RDW: 13.5 % (ref 11.5–14.5)
WBC: 6.8 10*3/uL (ref 3.6–11.0)

## 2016-02-11 MED ORDER — CIPROFLOXACIN HCL 500 MG PO TABS: 500.0000 mg | ORAL_TABLET | Freq: Two times a day (BID) | ORAL | Status: DC

## 2016-02-11 MED ORDER — KETOROLAC TROMETHAMINE 60 MG/2ML IM SOLN
60.0000 mg | Freq: Once | INTRAMUSCULAR | Status: AC
Start: 2016-02-11 — End: 2016-02-11
  Administered 2016-02-11: 60 mg via INTRAMUSCULAR

## 2016-02-11 NOTE — ED Provider Notes (Signed)
CSN: [id number]     Arrival date & time 02/11/16  4098 History   First MD Initiated Contact with Patient 02/11/16 1202     Chief Complaint  Patient presents with  . Abdominal Pain   (Consider location/radiation/quality/duration/timing/severity/associated sxs/prior Treatment) HPI   30 year old female who was seen here last Saturday diagnosed with an URI who states that she gradually improved and was able return to work on Monday. However yesterday she began to have sharp pains in her right lower quadrant 102.5 and watery stools for 5-6 BMs today no blood or mucus and vomiting 7-8 times. She has not eaten in Couple of days because she has had no appetite. She has not eaten on the local economy and is prepared all of her meals at home and all of her family are feeling well. She has a history of having a 3 C-sections in the past. She says that her pain is sharply localized in the right lower quadrant and she indicates the pelvic area. There is no radiation of the pain. No signs of they are a fever of 100 pulse 103 respirations 18 blood pressure 110/77 and an 100% on room air. Denies any vaginal discharge. Her last sexual relations was 3 weeks ago and she did experienced no dyspareunia at that time.  Past Medical History  Diagnosis Date  . Gestational diabetes    Past Surgical History  Procedure Laterality Date  . Cesarean section     Family History  Problem Relation Age of Onset  . Hypertension Mother    Social History  Substance Use Topics  . Smoking status: Former Games developer  . Smokeless tobacco: None  . Alcohol Use: No   OB History    No data available     Review of Systems  Allergies  Hydrocodone and Latex  Home Medications   Prior to Admission medications   Medication Sig Start Date End Date Taking? Authorizing Provider  benzonatate (TESSALON) 200 MG capsule Take 1 capsule (200 mg total) by mouth 3 (three) times daily as needed for cough. 02/05/16   Lutricia Feil, PA-C   ciprofloxacin (CIPRO) 500 MG tablet Take 1 tablet (500 mg total) by mouth 2 (two) times daily. 02/11/16   Lutricia Feil, PA-C   Meds Ordered and Administered this Visit   Medications  ketorolac (TORADOL) injection 60 mg (60 mg Intramuscular Given 02/11/16 1450)    BP 110/77 mmHg  Pulse 103  Temp(Src) 100 F (37.8 C) (Tympanic)  Resp 18  Ht  (1.575 m)  Wt 191 lb (86.637 kg)  BMI 34.93 kg/m2  SpO2 100%  LMP 01/15/2016 (Approximate) No data found.   Physical Exam  ED Course  Procedures (including critical care time)  Labs Review Labs Reviewed  COMPREHENSIVE METABOLIC PANEL - Abnormal; Notable for the following:    Glucose, Bld 103 (*)    All other components within normal limits  URINALYSIS COMPLETEWITH MICROSCOPIC ([facility] ONLY) - Abnormal; Notable for the following:    Bilirubin Urine 1+ (*)    Specific Gravity, Urine >1.030 (*)    Hgb urine dipstick 1+ (*)    Protein, ur 30 (*)    Bacteria, UA FEW (*)    Squamous Epithelial / LPF 6-30 (*)    All other components within normal limits  CBC WITH DIFFERENTIAL/PLATELET    Imaging Review Ct Renal Stone Study  02/11/2016  CLINICAL DATA:  Right lower quadrant pain 2 days. History of kidney stones. EXAM: CT ABDOMEN AND PELVIS WITHOUT  CONTRAST TECHNIQUE: Multidetector CT imaging of the abdomen and pelvis was performed following the standard protocol without IV contrast. COMPARISON:  10/06/2014 FINDINGS: Lung bases are within normal. Abdominal images demonstrate a normal spleen, pancreas, liver, gallbladder and adrenal glands. The stomach is within normal. Kidneys are normal in size without hydronephrosis or nephrolithiasis. Ureters are within normal. Appendix is normal. Colon small bowel are within normal. There is no evidence of free fluid or focal inflammatory change. Vascular structures are normal. Retro aortic left renal vein is present. Pelvic images demonstrate a normal bladder, uterus, ovaries and rectum. No free pelvic  fluid. Remaining bones and soft tissues are within normal. IMPRESSION: No acute findings in the abdomen/pelvis. No renal/ ureteral stones or obstruction. Electronically Signed   By: Elberta Fortis M.D.   On: 02/11/2016 14:44     Visual Acuity Review  Right Eye Distance:   Left Eye Distance:   Bilateral Distance:    Right Eye Near:   Left Eye Near:    Bilateral Near:     14:50 Medication Given KF  ketorolac (TORADOL) injection 60 mg - Dose: 60 mg ; Route: Intramuscular ; Site: Left Anterior Thigh ; Scheduled Time: 1500         MDM   1. Abdominal pain, RLQ (right lower quadrant)    New Prescriptions   CIPROFLOXACIN (CIPRO) 500 MG TABLET    Take 1 tablet (500 mg total) by mouth 2 (two) times daily.  Plan: 1. Test/x-ray results and diagnosis reviewed with patient 2. rx as per orders; risks, benefits, potential side effects reviewed with patient 3. Recommend supportive treatment with Motrin Tylenol for control of fever and pain. I have told her that with a negative CT scan at some red blood cells in her urine and no stones is likely that she may have a urinary tract infection that is early. We will culture the urine and empirically place her on some Cipro for 5 days. Over the weekend if she has worsening of her symptoms she will go to the emergency room for further evaluation and treatment. However she if she is improving I would like her to establish care with Dr. Hollace Hayward; she will call him on Monday for an appointment. 4. F/u prn if symptoms worsen or don't improve     Lutricia Feil, PA-C 02/11/16 1517

## 2016-02-11 NOTE — ED Notes (Signed)
CT Renal Stone study scheduled for [facility] by Sunny Schlein from Radiology Scheduling. Heather Brockel from CT at [facility] notified

## 2016-02-11 NOTE — ED Notes (Addendum)
Seen here last Saturday and Dx with URI. States symptoms no better. When PA entered room, patient c/o was abdominal pain with nausea x 2 days

## 2016-02-11 NOTE — Discharge Instructions (Signed)

## 2016-02-13 LAB — URINE CULTURE

## 2016-04-05 ENCOUNTER — Encounter: Payer: Self-pay | Admitting: Emergency Medicine

## 2016-04-05 ENCOUNTER — Emergency Department
Admission: EM | Admit: 2016-04-05 | Discharge: 2016-04-05 | Disposition: A | Discharge status: Home / Self Care | Payer: Medicaid Other | Attending: Emergency Medicine | Admitting: Emergency Medicine

## 2016-04-05 DIAGNOSIS — F319 Bipolar disorder, unspecified: Secondary | ICD-10-CM | POA: Insufficient documentation

## 2016-04-05 DIAGNOSIS — R55 Syncope and collapse: Secondary | ICD-10-CM

## 2016-04-05 DIAGNOSIS — Z87891 Personal history of nicotine dependence: Secondary | ICD-10-CM | POA: Diagnosis not present

## 2016-04-05 HISTORY — DX: Bipolar disorder, unspecified: F31.9

## 2016-04-05 HISTORY — DX: Other chronic pain: G89.29

## 2016-04-05 HISTORY — DX: Personal history of gestational diabetes: Z86.32

## 2016-04-05 HISTORY — DX: Conversion disorder with seizures or convulsions: F44.5

## 2016-04-05 HISTORY — DX: Headache, unspecified: R51.9

## 2016-04-05 HISTORY — DX: Headache: R51

## 2016-04-05 LAB — CBC WITH DIFFERENTIAL/PLATELET
Basophils Absolute: 0 10*3/uL (ref 0–0.1)
Basophils Relative: 1 %
Eosinophils Absolute: 0.1 10*3/uL (ref 0–0.7)
Eosinophils Relative: 2 %
HCT: 39.4 % (ref 35.0–47.0)
Hemoglobin: 13.5 g/dL (ref 12.0–16.0)
Lymphocytes Relative: 34 %
Lymphs Abs: 2.5 10*3/uL (ref 1.0–3.6)
MCH: 29 pg (ref 26.0–34.0)
MCHC: 34.2 g/dL (ref 32.0–36.0)
MCV: 85 fL (ref 80.0–100.0)
Monocytes Absolute: 0.5 10*3/uL (ref 0.2–0.9)
Monocytes Relative: 7 %
Neutro Abs: 4.3 10*3/uL (ref 1.4–6.5)
Neutrophils Relative %: 58 %
Platelets: 237 10*3/uL (ref 150–440)
RBC: 4.64 MIL/uL (ref 3.80–5.20)
RDW: 13.1 % (ref 11.5–14.5)
WBC: 7.4 10*3/uL (ref 3.6–11.0)

## 2016-04-05 LAB — COMPREHENSIVE METABOLIC PANEL
ALT: 27 U/L (ref 14–54)
AST: 21 U/L (ref 15–41)
Albumin: 4.2 g/dL (ref 3.5–5.0)
Alkaline Phosphatase: 81 U/L (ref 38–126)
Anion gap: 3 — ABNORMAL LOW (ref 5–15)
BUN: 17 mg/dL (ref 6–20)
CO2: 26 mmol/L (ref 22–32)
Calcium: 9.2 mg/dL (ref 8.9–10.3)
Chloride: 106 mmol/L (ref 101–111)
Creatinine, Ser: 0.6 mg/dL (ref 0.44–1.00)
GFR calc Af Amer: >60 mL/min (ref >60)
GFR calc non Af Amer: >60 mL/min (ref >60)
Glucose, Bld: 103 mg/dL — ABNORMAL HIGH (ref 65–99)
Potassium: 3.7 mmol/L (ref 3.5–5.1)
Sodium: 135 mmol/L (ref 135–145)
Total Bilirubin: 0.5 mg/dL (ref 0.3–1.2)
Total Protein: 7.5 g/dL (ref 6.5–8.1)

## 2016-04-05 LAB — MAGNESIUM: Magnesium: 2 mg/dL (ref 1.7–2.4)

## 2016-04-05 LAB — URINALYSIS COMPLETE WITH MICROSCOPIC (ARMC ONLY)
Bilirubin Urine: NEGATIVE
Glucose, UA: NEGATIVE mg/dL
Ketones, ur: NEGATIVE mg/dL
Leukocytes, UA: NEGATIVE
Nitrite: NEGATIVE
Protein, ur: 30 mg/dL — AB
Specific Gravity, Urine: 1.018 (ref 1.005–1.030)
pH: 5 (ref 5.0–8.0)

## 2016-04-05 LAB — POCT PREGNANCY, URINE: Preg Test, Ur: NEGATIVE

## 2016-04-05 MED ORDER — SODIUM CHLORIDE 0.9 % IV BOLUS (SEPSIS)
1000.0000 mL | INTRAVENOUS | Status: AC
  Administered 2016-04-05: 1000 mL via INTRAVENOUS

## 2016-04-05 MED ORDER — ACETAMINOPHEN 500 MG PO TABS
1000.0000 mg | ORAL_TABLET | Freq: Once | ORAL | Status: AC
  Administered 2016-04-05: 1000 mg via ORAL
  Filled 2016-04-05: qty 2

## 2016-04-05 NOTE — ED Provider Notes (Signed)
[facility] Emergency Department Provider Note  ____________________________________________  Time seen: Approximately 12:10 PM  I have reviewed the triage vital signs and the nursing notes.   HISTORY  Chief Complaint Loss of Consciousness    HPI Heidi Hudson is a 30 y.o. female who resents by EMS for evaluation of a possible seizure episode versus near syncopal or syncopal episode while at work.  She reports that she has a history of seizure disorder but is not on medication and [facility].  She has had similar episodes in the past.  Reportedly she was at work today when she collapsed and possibly struck the back of her head when she fell.  She was not confused or particularly sleepy afterwards.  She was acting subdued when EMS got there but is alert and oriented during transport and arrival to our ED.  she does not have any neck pain, shortness of breath, abdominal pain, nausea, vomiting, diarrhea, dysuria.  She states that she has a little bit of chest discomfort as well as a moderate headache.  Nothing makes it better nothing makes it worse.  She reports that she is not on any medications for her seizures and that they tend to get worse when she is under a lot of stress.  She does not feel like she is under a lot of stress at this time.   Past Medical History  Diagnosis Date  . History of gestational diabetes   . Psychogenic nonepileptic seizure   . Chronic headaches   . Bipolar disorder (HCC)     There are no active problems to display for this patient.   Past Surgical History  Procedure Laterality Date  . Cesarean section      Current Outpatient Rx  Name  Route  Sig  Dispense  Refill  . benzonatate (TESSALON) 200 MG capsule   Oral   Take 1 capsule (200 mg total) by mouth 3 (three) times daily as needed for cough.   20 capsule   0   . ciprofloxacin (CIPRO) 500 MG tablet   Oral   Take 1 tablet (500 mg total)  by mouth 2 (two) times daily.   10 tablet   0     Allergies Hydrocodone and Latex  Family History  Problem Relation Age of Onset  . Hypertension Mother     Social History Social History  Substance Use Topics  . Smoking status: Former Games developermoker  . Smokeless tobacco: None  . Alcohol Use: No    Review of Systems Constitutional: No fever/chills Eyes: No visual changes. ENT: No sore throat. Cardiovascular: Mild chest discomfort. Respiratory: Denies shortness of breath. Gastrointestinal: No abdominal pain.  No nausea, no vomiting.  No diarrhea.  No constipation. Genitourinary: Negative for dysuria. Musculoskeletal: Negative for back pain.  Pain in the back of her head after fall. Skin: Negative for rash. Neurological: Mild headache.  No focal numbness nor weakness.  10-point ROS otherwise negative.  ____________________________________________   PHYSICAL EXAM:  ED Triage Vitals  Enc Vitals Group     BP 04/05/16 1218 131/84 mmHg     Pulse Rate 04/05/16 1216 72     Resp 04/05/16 1216 17     Temp 04/05/16 1216 98.1 F (36.7 C)     Temp Source 04/05/16 1216 Oral     SpO2 04/05/16 1216 94 %     Weight 04/05/16 1216 184 lb (83.462 kg)     Height 04/05/16 1216   (1.575 m)     Head Cir --      Peak Flow --      Pain Score 04/05/16 1217 8     Pain Loc --      Pain Edu? --      Excl. in GC? --      Constitutional: Alert and oriented. Well appearing and in no acute distress. Eyes: Conjunctivae are normal. PERRL. EOMI. Head: Atraumatic.  I feel no hematoma the back of her head where she reports that it is sore. Nose: No congestion/rhinnorhea. Mouth/Throat: Mucous membranes are moist.  Oropharynx non-erythematous. Neck: No stridor.  No meningeal signs.  No cervical spine tenderness to palpation. Cardiovascular: Normal rate, regular rhythm. Good peripheral circulation. Grossly normal heart sounds.  Mild tenderness to palpation of the chest wall. Respiratory: Normal  respiratory effort.  No retractions. Lungs CTAB. Gastrointestinal: Soft and nontender. No distention.  Musculoskeletal: No lower extremity tenderness nor edema. No gross deformities of extremities. Neurologic:  Normal speech and language. No gross focal neurologic deficits are appreciated.  Skin:  Skin is warm, dry and intact. No rash noted. Psychiatric: Mood and affect are normal. Speech and behavior are normal.  ____________________________________________   LABS (all labs ordered are listed, but only abnormal results are displayed)  Labs Reviewed  COMPREHENSIVE METABOLIC PANEL - Abnormal; Notable for the following:    Glucose, Bld 103 (*)    Anion gap 3 (*)    All other components within normal limits  URINALYSIS COMPLETEWITH MICROSCOPIC ([facility] ONLY) - Abnormal; Notable for the following:    Color, Urine YELLOW (*)    APPearance HAZY (*)    Hgb urine dipstick 3+ (*)    Protein, ur 30 (*)    Bacteria, UA RARE (*)    Squamous Epithelial / LPF 6-30 (*)    All other components within normal limits  CBC WITH DIFFERENTIAL/PLATELET  MAGNESIUM  POC URINE PREG, ED  POCT PREGNANCY, URINE   ____________________________________________  EKG  ED ECG REPORT I, Kannan Proia, the attending physician, personally viewed and interpreted this ECG.  Date: 04/05/2016 EKG Time: 12:06 Rate: 66 Rhythm: normal sinus rhythm QRS Axis: normal Intervals: normal ST/T Wave abnormalities: normal Conduction Disturbances: none Narrative Interpretation: unremarkable  ____________________________________________  RADIOLOGY  No results found.  ____________________________________________   PROCEDURES  Procedure(s) performed: None  Critical Care performed: No ____________________________________________   INITIAL IMPRESSION / ASSESSMENT AND PLAN / ED COURSE  Pertinent labs & imaging results that were available during my care of the patient were reviewed by me and considered in my  medical decision making (see chart for details).  After evaluating the patient I looked up her record and care everywhere.  She has had many visits to [facility] and notes from her neurologist are present and indicated that she has psychogenic nonepileptic seizure-like activity from time to time.  She also has a diagnosis of bipolar disorder and chronic headaches.  She is supposed to be taking Zomig and magnesium supplements.  She had an outpatient MRI relatively recently that is unremarkable.  There is a plan in place to refer her to the [facility] headache clinic.  Based on her presentation today and the lack of a postictal state I find it very unlikely that she had true epileptiform seizure activity.  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  I will provide a liter of fluids and some Tylenol for her headache and check  basic labs.  I do not believe she would benefit from additional imaging of her head at this time as any trauma she had was very minor and there are no physical signs of trauma and I worried that the patient has an of chronic problems for which she received imaging that I would be unnecessarily exposing her to more radiation.  ----------------------------------------- 1:57 PM on 04/05/2016 -----------------------------------------  The patient is then in no distress since come to the emergency department and is chatting happily with her for friends that are currently in the room.  Her workup is completely unremarkable.  I encouraged her to drink any by mouth fluids and to follow-up with her neurologist.  I gave my usual and customary return precautions.     ____________________________________________  FINAL CLINICAL IMPRESSION(S) / ED DIAGNOSES  Final diagnoses:  Syncope, unspecified syncope type      NEW MEDICATIONS STARTED DURING THIS VISIT:  New Prescriptions   No medications on file      Note:  This document was  prepared using Dragon voice recognition software and may include unintentional dictation errors.   Loleta Rose, MD 04/05/16 1357

## 2016-04-05 NOTE — ED Notes (Signed)
Pt was at work when she became unresponsive and EMS was called by coworkers.  Pt states she was having chest pain prior to syncope and states "I had a headache when I woke up because I think I may have hit the conveyor belt on a machine when I fell".  Pt has a history of seizures, newly diagnosed within the past year.  Pt denies taking any medications for seizures.  EMS noted on the scene that the pt had loss of memory of events before and after the episode, and that patient was initially slightly confused.  Pt alert and oriented upon arrival to ED, and in no distress.

## 2016-04-05 NOTE — Discharge Instructions (Signed)
You have been seen today in the Emergency Department (ED)  for syncope (passing out).  Your workup including labs and EKG show reassuring results.  Your symptoms may be due to dehydration, so it is important that you drink plenty of non-alcoholic fluids. ° °Please call your regular doctor as soon as possible to schedule the next available clinic appointment to follow up with him/her regarding your visit to the ED and your symptoms.  Return to the Emergency Department (ED)  if you have any further syncopal episodes (pass out again) or develop ANY chest pain, pressure, tightness, trouble breathing, sudden sweating, or other symptoms that concern you. ° ° ° °Syncope °Syncope is a medical term for fainting or passing out. This means you lose consciousness and drop to the ground. People are generally unconscious for less than 5 minutes. You may have some muscle twitches for up to 15 seconds before waking up and returning to normal. Syncope occurs more often in older adults, but it can happen to anyone. While most causes of syncope are not dangerous, syncope can be a sign of a serious medical problem. It is important to seek medical care.  °CAUSES  °Syncope is caused by a sudden drop in blood flow to the brain. The specific cause is often not determined. Factors that can bring on syncope include: °· Taking medicines that lower blood pressure. °· Sudden changes in posture, such as standing up quickly. °· Taking more medicine than prescribed. °· Standing in one place for too long. °· Seizure disorders. °· Dehydration and excessive exposure to heat. °· Low blood sugar (hypoglycemia). °· Straining to have a bowel movement. °· Heart disease, irregular heartbeat, or other circulatory problems. °· Fear, emotional distress, seeing blood, or severe pain. °SYMPTOMS  °Right before fainting, you may: °· Feel dizzy or light-headed. °· Feel nauseous. °· See all white or all black in your field of vision. °· Have cold, clammy  skin. °DIAGNOSIS  °Your health care provider will ask about your symptoms, perform a physical exam, and perform an electrocardiogram (ECG) to record the electrical activity of your heart. Your health care provider may also perform other heart or blood tests to determine the cause of your syncope which may include: °· Transthoracic echocardiogram (TTE). During echocardiography, sound waves are used to evaluate how blood flows through your heart. °· Transesophageal echocardiogram (TEE). °· Cardiac monitoring. This allows your health care provider to monitor your heart rate and rhythm in real time. °· Holter monitor. This is a portable device that records your heartbeat and can help diagnose heart arrhythmias. It allows your health care provider to track your heart activity for several days, if needed. °· Stress tests by exercise or by giving medicine that makes the heart beat faster. °TREATMENT  °In most cases, no treatment is needed. Depending on the cause of your syncope, your health care provider may recommend changing or stopping some of your medicines. °HOME CARE INSTRUCTIONS °· Have someone stay with you until you feel stable. °· Do not drive, use machinery, or play sports until your health care provider says it is okay. °· Keep all follow-up appointments as directed by your health care provider. °· Lie down right away if you start feeling like you might faint. Breathe deeply and steadily. Wait until all the symptoms have passed. °· Drink enough fluids to keep your urine clear or pale yellow. °· If you are taking blood pressure or heart medicine, get up slowly and take several minutes to sit   and then stand. This can reduce dizziness. °SEEK IMMEDIATE MEDICAL CARE IF:  °· You have a severe headache. °· You have unusual pain in the chest, abdomen, or back. °· You are bleeding from your mouth or rectum, or you have black or tarry stool. °· You have an irregular or very fast heartbeat. °· You have pain with  breathing. °· You have repeated fainting or seizure-like jerking during an episode. °· You faint when sitting or lying down. °· You have confusion. °· You have trouble walking. °· You have severe weakness. °· You have vision problems. °If you fainted, call your local emergency services (911 in [place] Do not drive yourself to the hospital.  °  °This information is not intended to replace advice given to you by your health care provider. Make sure you discuss any questions you have with your health care provider. °  °Document Released: 12/11/2005 Document Revised: 04/27/2015 Document Reviewed: 02/09/2012 °Elsevier Interactive Patient Education ©2016 [employer] ° °

## 2017-02-05 ENCOUNTER — Encounter (HOSPITAL_COMMUNITY): Payer: Self-pay | Admitting: Emergency Medicine

## 2017-02-05 ENCOUNTER — Ambulatory Visit (HOSPITAL_COMMUNITY)
Admission: RE | Admit: 2017-02-05 | Discharge: 2017-02-05 | Disposition: A | Discharge status: Home / Self Care | Payer: Medicaid Other | Attending: Psychiatry | Admitting: Psychiatry

## 2017-02-05 DIAGNOSIS — F1320 Sedative, hypnotic or anxiolytic dependence, uncomplicated: Secondary | ICD-10-CM | POA: Insufficient documentation

## 2017-02-05 DIAGNOSIS — F411 Generalized anxiety disorder: Secondary | ICD-10-CM | POA: Diagnosis not present

## 2017-02-05 DIAGNOSIS — F319 Bipolar disorder, unspecified: Secondary | ICD-10-CM | POA: Diagnosis not present

## 2017-02-05 DIAGNOSIS — F410 Panic disorder [episodic paroxysmal anxiety] without agoraphobia: Secondary | ICD-10-CM

## 2017-02-05 NOTE — BH Assessment (Signed)
Tele Assessment Note  [facility] to [facility]. Pt is pleasant and oriented x 4. She reports hx of bipolar disorder, anxiety and MDD. She sts she needs to get back on psych meds. Pt reports [facility] was fired by [facility] in late 2017. She reports she had been taking 3 xanax and 3 klonopin daily until her 90 day supply ran out in late Dec 2017. She reports her depressive symptoms are increasing. She endorses fatigue, insomnia, unintended weight gain, tearfulness, worthlessness, irritability and almost daily panic attacks. She reports 3 suicide attemtps with most recent in 2011. She denies SI now. [facility] Pt denies homicidal thoughts or physical aggression. Pt denies having access to firearms. Pt denies having any legal problems at this time. Pt denies hallucinations. Pt does not appear to be responding to internal stimuli and exhibits no delusional thought. Pt's reality testing appears to be intact. She reports she had post partum depression with youngest son. Pt sts she is seeing therapist [facility] at [place]. She says they won't let her sees psychiatrist for at least 30 days. Pt reports she really needs to get back on her psych meds. She endorses excessive spending and reports husband had to declare bankruptcy d/t her spending.   [facility] is an 31 y.o. female.   Diagnosis: Bipolar I Disorder Generalized Anxiety Disorder with Panic Attacks  Past Medical History:  Past Medical History:  Diagnosis Date  . Bipolar disorder (HCC)   . Chronic headaches   . History of gestational diabetes   . Psychogenic nonepileptic seizure     Past Surgical History:  Procedure Laterality Date  . CESAREAN SECTION      Family History:  Family History  Problem Relation Age of Onset  . Hypertension Mother     Social History:  reports that she has quit smoking. She does not have any smokeless tobacco history on file. She reports that  she does not drink alcohol or use drugs.  Additional Social History:  Alcohol / Drug Use Pain Medications: pt denies abuse - see pta meds list Prescriptions: pt says she was taking 3 xanax and 3 klonopin prescribed by her psychiatrist in [place] until the 90 day pill bottles were empty - pt says hasn't had either med since Xmas 2017 Over the Counter: pt denies abuse - see pta meds list History of alcohol / drug use?: No history of alcohol / drug abuse  CIWA:   COWS:    PATIENT STRENGTHS: (choose at least two) Ability for insight Capable of independent living Communication skills Motivation for treatment/growth Physical Health Supportive family/friends  Allergies:  Allergies  Allergen Reactions  . Hydrocodone Hives and Nausea And Vomiting  . Latex Hives and Swelling    Home Medications:  (Not in a hospital admission)  OB/GYN Status:  No LMP recorded.  General Assessment Data [facility] of Assessment: [facility] Assessment: In system Is this a Tele or Face-to-Face Assessment?: Face-to-Face Is this an Initial Assessment or a Re-assessment for this encounter?: Initial Assessment Marital status: Married Heidi name: Engineer, production - she says she didn't change it to davis? Is patient pregnant?: No Pregnancy Status: No Living Arrangements: Spouse/significant other, Children (3 sons (8,5 and 16 mos)) Can pt return to current living arrangement?: Yes Admission Status: Voluntary Is patient capable of signing voluntary admission?: Yes Referral Source: Self/Family/Friend Insurance type: cardinal medicaid  Medical Screening Exam [facility] Walk-in ONLY) Medical  Exam completed: Yes  Crisis Care Plan Living Arrangements: Spouse/significant other, Children (3 sons (8,5 and 16 mos)) Name of Psychiatrist: none Name of Therapist: [facility]  Education Status Is patient currently in school?: No Highest grade of school patient has completed:  (graduated from  cosmetology school)  Risk to self with the past 6 months Suicidal Ideation: No Has patient been a risk to self within the past 6 months prior to admission? : No Suicidal Intent: No Has patient had any suicidal intent within the past 6 months prior to admission? : No Is patient at risk for suicide?: No Suicidal Plan?: No Has patient had any suicidal plan within the past 6 months prior to admission? : No Access to Means:  (n/a) What has been your use of drugs/alcohol within the last 12 months?: none Previous Attempts/Gestures: Yes How many times?: 3 (most recent in 2011) Other Self Harm Risks: none Triggers for Past Attempts:  (death of someone who was basically her stepmom) Intentional Self Injurious Behavior: None Family Suicide History: No (doesn't know bio dad's side well) Recent stressful life event(s):  (recent depressive symptoms, conflict with husband) Persecutory voices/beliefs?: No Depression: Yes Depression Symptoms: Insomnia, Tearfulness, Guilt, Feeling worthless/self pity, Feeling angry/irritable, Fatigue Substance abuse history and/or treatment for substance abuse?: No Suicide prevention information given to non-admitted patients: Not applicable  Risk to Others within the past 6 months Homicidal Ideation: No Does patient have any lifetime risk of violence toward others beyond the six months prior to admission? : No Thoughts of Harm to Others: No Current Homicidal Intent: No Current Homicidal Plan: No Access to Homicidal Means: No Identified Victim: none History of harm to others?: No Assessment of Violence: None Noted Violent Behavior Description: pt denies hx violence Does patient have access to weapons?: No (2 shotguns locked in safe and she doesn't know passcode) Criminal Charges Pending?: No Does patient have a court date: No Is patient on probation?: No  Psychosis Hallucinations: None noted Delusions: None noted  Mental Status Report Appearance/Hygiene:  Unremarkable (in appropriate street clothes) Eye Contact: Good Motor Activity: Freedom of movement, Restlessness Speech: Logical/coherent, Unremarkable Level of Consciousness: Alert, Crying Mood: Depressed, Anxious, Sad, Irritable Affect: Anxious, Depressed, Sad Anxiety Level: Panic Attacks Panic attack frequency: almost daily Most recent panic attack: this am Thought Processes: Coherent, Relevant Judgement: Unimpaired Orientation: Person, Place, Time, Situation Obsessive Compulsive Thoughts/Behaviors: None  Cognitive Functioning Concentration: Normal Memory: Recent Intact, Remote Intact IQ: Average Insight: Fair Impulse Control: Poor Appetite: Good Weight Gain:  (pt reports unknown amt of weight gained) Sleep: Decreased Total Hours of Sleep: 2 Vegetative Symptoms: Staying in bed  ADLScreening [facility]) Patient's cognitive ability adequate to safely complete daily activities?: Yes Patient able to express need for assistance with ADLs?: Yes Independently performs ADLs?: Yes (appropriate for developmental age)  Prior Inpatient Therapy Prior Inpatient Therapy: Yes Prior Therapy Dates: 2010 and when 31 yrs old [facility] Reason for Treatment: suicide attempt, depression  Prior Outpatient Therapy Prior Outpatient Therapy: Yes Prior Therapy Dates: recently Prior Therapy [facility]ilty/Provider([facility] [place] Reason for Treatment: therapy Does patient have an ACCT team?: No Does patient have Int[facility]?  : No Does patient have [facility]? : No Does patient have [facility]?: No  ADL Screening (condition at time of admission) Patient's cognitive ability adequate to safely complete daily activities?: Yes Is the patient deaf or have difficulty hearing?: No Does  the patient have difficulty seeing, even when wearing glasses/contacts?: No Does the patient have  difficulty concentrating, remembering, or making decisions?: No Patient able to express need for assistance with ADLs?: Yes Does the patient have difficulty dressing or bathing?: No Independently performs ADLs?: Yes (appropriate for developmental age) Does the patient have difficulty walking or climbing stairs?: No Weakness of Legs: None Weakness of Arms/Hands: None  Home Assistive Devices/Equipment Home Assistive Devices/Equipment: None    Abuse/Neglect Assessment (Assessment to be complete while patient is alone) Physical Abuse: Denies Verbal Abuse: Denies Sexual Abuse: Denies Exploitation of patient/patient's resources: Denies Self-Neglect: Denies     Merchant navy officer (For Healthcare) Does Patient Have a Medical Advance Directive?: No Would patient like information on creating a medical advance directive?: No - Patient declined    Additional Information 1:1 In Past 12 Months?: No CIRT Risk: No Elopement Risk: No Does patient have medical clearance?: No     Disposition:  Disposition Initial Assessment Completed for this Encounter: Yes Disposition of Patient: Outpatient treatment (dr Lucianne Muss recommends [facility]-I[facility]) Type of outpatient treatment: Psych Intensive Outpatient  Bertil Brickey P 02/05/2017 11:08 AM

## 2017-02-05 NOTE — H&P (Signed)
Behavioral Health Medical Screening Exam  Deedra EhrichVictoria M Oneida ArenasBaker Davis is an 31 y.o. female.  Total Time spent with patient: 30 minutes  Psychiatric Specialty Exam: Physical Exam  Nursing note and vitals reviewed. Psychiatric: Her behavior is normal. Judgment and thought content normal. Her mood appears anxious. Cognition and memory are normal.    Review of Systems  All other systems reviewed and are negative.   There were no vitals taken for this visit.There is no height or weight on file to calculate BMI.  General Appearance: Casual  Eye Contact:  Good  Speech:  Clear and Coherent  Volume:  Normal  Mood:  Anxious  Affect:  Appropriate and Congruent  Thought Process:  Coherent  Orientation:  Full (Time, Place, and Person)  Thought Content:  Logical  Suicidal Thoughts:  No  Homicidal Thoughts:  No  Memory:  Immediate;   Good Recent;   Good Remote;   Good  Judgement:  Good  Insight:  Good  Psychomotor Activity:  Normal  Concentration: Concentration: Good and Attention Span: Good  Recall:  Good  Fund of Knowledge:Good  Language: Good  Akathisia:  Negative  Handed:  Right  AIMS (if indicated):     Assets:  Communication Skills Desire for Improvement Housing Physical Health Resilience Social Support Talents/Skills  Sleep:       Musculoskeletal: Strength & Muscle Tone: within normal limits Gait & Station: normal Patient leans: N/A  98.8 73 18 112/74 98% room air  Recommendations:  Based on my evaluation the patient does not appear to have an emergency medical condition.  [place] several MH agencies have not been able to accept her medicaid.  She states prior to coming she was on Depakote and depended on BZD's.  She is married and has children.  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  [facility] no longer practices medicine and she was no longer able to get it.  She  reports that she had good hisotry on Depakote and awas more stable.  Referral made to IOP and [facility] to get patient connected with MD to help manage MH and prescribe her meds.  She denies SI HI and AVH.    Lindwood QuaSheila May Aiyannah Fayad, NP [facility] 02/05/2017, 12:24 PM

## 2017-02-28 ENCOUNTER — Emergency Department
Admission: EM | Admit: 2017-02-28 | Discharge: 2017-02-28 | Disposition: A | Discharge status: Home / Self Care | Payer: Medicaid Other | Attending: Emergency Medicine | Admitting: Emergency Medicine

## 2017-02-28 ENCOUNTER — Emergency Department: Payer: Medicaid Other

## 2017-02-28 DIAGNOSIS — Z87891 Personal history of nicotine dependence: Secondary | ICD-10-CM | POA: Insufficient documentation

## 2017-02-28 DIAGNOSIS — Z91040 Latex allergy status: Secondary | ICD-10-CM | POA: Insufficient documentation

## 2017-02-28 DIAGNOSIS — Z79899 Other long term (current) drug therapy: Secondary | ICD-10-CM | POA: Insufficient documentation

## 2017-02-28 DIAGNOSIS — R0789 Other chest pain: Secondary | ICD-10-CM | POA: Insufficient documentation

## 2017-02-28 DIAGNOSIS — R079 Chest pain, unspecified: Secondary | ICD-10-CM | POA: Diagnosis present

## 2017-02-28 DIAGNOSIS — R202 Paresthesia of skin: Secondary | ICD-10-CM | POA: Diagnosis not present

## 2017-02-28 LAB — BASIC METABOLIC PANEL
Anion gap: 8 (ref 5–15)
BUN: 6 mg/dL (ref 6–20)
CO2: 26 mmol/L (ref 22–32)
Calcium: 9.4 mg/dL (ref 8.9–10.3)
Chloride: 106 mmol/L (ref 101–111)
Creatinine, Ser: 0.53 mg/dL (ref 0.44–1.00)
GFR calc Af Amer: >60 mL/min (ref >60)
GFR calc non Af Amer: >60 mL/min (ref >60)
Glucose, Bld: 106 mg/dL — ABNORMAL HIGH (ref 65–99)
Potassium: 3.4 mmol/L — ABNORMAL LOW (ref 3.5–5.1)
Sodium: 140 mmol/L (ref 135–145)

## 2017-02-28 LAB — CBC
HCT: 43.6 % (ref 35.0–47.0)
Hemoglobin: 15.2 g/dL (ref 12.0–16.0)
MCH: 30.2 pg (ref 26.0–34.0)
MCHC: 34.9 g/dL (ref 32.0–36.0)
MCV: 86.7 fL (ref 80.0–100.0)
Platelets: 261 10*3/uL (ref 150–440)
RBC: 5.03 MIL/uL (ref 3.80–5.20)
RDW: 13.6 % (ref 11.5–14.5)
WBC: 8.1 10*3/uL (ref 3.6–11.0)

## 2017-02-28 LAB — HEPATIC FUNCTION PANEL
ALT: 20 U/L (ref 14–54)
AST: 21 U/L (ref 15–41)
Albumin: 4.9 g/dL (ref 3.5–5.0)
Alkaline Phosphatase: 91 U/L (ref 38–126)
Bilirubin, Direct: <0.1 mg/dL — ABNORMAL LOW (ref 0.1–0.5)
Total Bilirubin: 0.6 mg/dL (ref 0.3–1.2)
Total Protein: 8.1 g/dL (ref 6.5–8.1)

## 2017-02-28 LAB — TROPONIN I: Troponin I: <0.03 ng/mL (ref <0.03)

## 2017-02-28 LAB — LIPASE, BLOOD: Lipase: 24 U/L (ref 11–51)

## 2017-02-28 MED ORDER — OMEPRAZOLE MAGNESIUM 20 MG PO TBEC: 20.0000 mg | DELAYED_RELEASE_TABLET | Freq: Every day | ORAL | 1 refills | Status: DC

## 2017-02-28 NOTE — ED Provider Notes (Signed)
[facility] Emergency Department Provider Note  ____________________________________________   None    (approximate)  I have reviewed the triage vital signs and the nursing notes.   HISTORY  Chief Complaint Chest Pain and Numbness    HPI TurkeyVictoria M Oneida ArenasBaker Davis is a 31 y.o. female whose relevant medical history includes psychogenic nonepileptic seizures, bipolar disorder, and chronic headacheswho presents for evaluation of epigastric vs chest pain with some numbness/tingling that radiates down her right arm and right leg.  She states that this happens to her quite frequently but it was little bit worse last night than she is used to.  Usually the symptoms go away on their own which they did this time as well.  Nothing particular makes it better or worse.  She describes the pain as being either epigastric or below her sternum and it feels like a sharp burning pain.  It is worse when she lies flat.  She takes no medicine for acid reflux and does not notice any correlation with what she eats.  She has no pain in her abdomen and up under her ribs.  She denies fever/chills, nausea, vomiting, lower abdominal pain, dysuria.   Past Medical History:  Diagnosis Date  . Bipolar disorder (HCC)   . Chronic headaches   . History of gestational diabetes   . Psychogenic nonepileptic seizure     There are no active problems to display for this patient.   Past Surgical History:  Procedure Laterality Date  . CESAREAN SECTION      Prior to Admission medications   Medication Sig Start Date End Date Taking? Authorizing Provider  benzonatate (TESSALON) 200 MG capsule Take 1 capsule (200 mg total) by mouth 3 (three) times daily as needed for cough. Patient not taking: Reported on 04/05/2016 02/05/16   Lutricia FeilWilliam P Roemer, PA-C  ciprofloxacin (CIPRO) 500 MG tablet Take 1 tablet (500 mg total) by mouth 2 (two) times daily. Patient not taking: Reported on 04/05/2016 02/11/16    Lutricia FeilWilliam P Roemer, PA-C  omeprazole (PRILOSEC OTC) 20 MG tablet Take 1 tablet (20 mg total) by mouth daily. 02/28/17 02/28/18  Loleta Roseory Edder Bellanca, MD    Allergies Hydrocodone and Latex  Family History  Problem Relation Age of Onset  . Hypertension Mother     Social History Social History  Substance Use Topics  . Smoking status: Former Games developermoker  . Smokeless tobacco: Never Used  . Alcohol use No    Review of Systems Constitutional: No fever/chills Eyes: No visual changes. ENT: No sore throat. Cardiovascular: +chest pain. Respiratory: Denies shortness of breath. Gastrointestinal: No abdominal pain.  No nausea, no vomiting.  No diarrhea.  No constipation. Genitourinary: Negative for dysuria. Musculoskeletal: Negative for back pain. Skin: Negative for rash. Neurological: numbness/tingling in right arm and leg, resolved.  No focal weakness. 10-point ROS otherwise negative.  ____________________________________________   PHYSICAL EXAM:  VITAL SIGNS: ED Triage Vitals  Enc Vitals Group     BP 02/28/17 1257 106/83     Pulse Rate 02/28/17 1257 87     Resp 02/28/17 1257 18     Temp 02/28/17 1257 98.2 F (36.8 C)     Temp Source 02/28/17 1257 Oral     SpO2 02/28/17 1257 96 %     Weight 02/28/17 1258 156 lb (70.8 kg)     Height 02/28/17 1258 5\' 4"  (1.626 m)     Head Circumference --      Peak Flow --  Pain Score 02/28/17 1303 10     Pain Loc --      Pain Edu? --      Excl. in GC? --     Constitutional: Alert and oriented. Well appearing and in no acute distress. Eyes: Conjunctivae are normal. PERRL. EOMI. Head: Atraumatic. Nose: No congestion/rhinnorhea. Mouth/Throat: Mucous membranes are moist. Neck: No stridor.  No meningeal signs.   Cardiovascular: Normal rate, regular rhythm. Good peripheral circulation. Grossly normal heart sounds.Mildly reproducible anterior chest wall tenderness to palpation Respiratory: Normal respiratory effort.  No retractions. Lungs  CTAB. Gastrointestinal: Soft and nontender. No distention.  Musculoskeletal: No lower extremity tenderness nor edema. No gross deformities of extremities. Neurologic:  Normal speech and language. No gross focal neurologic deficits are appreciated.  Skin:  Skin is warm, dry and intact. No rash noted. Psychiatric: Mood and affect are normal. Speech and behavior are normal.  ____________________________________________   LABS (all labs ordered are listed, but only abnormal results are displayed)  Labs Reviewed  BASIC METABOLIC PANEL - Abnormal; Notable for the following:       Result Value   Potassium 3.4 (*)    Glucose, Bld 106 (*)    All other components within normal limits  HEPATIC FUNCTION PANEL - Abnormal; Notable for the following:    Bilirubin, Direct <0.1 (*)    All other components within normal limits  CBC  TROPONIN I  LIPASE, BLOOD   ____________________________________________  EKG  ED ECG REPORT I, Braylan Faul, the attending physician, personally viewed and interpreted this ECG.  Date: 02/28/2017 EKG Time: 12:53 Rate: 69 Rhythm: normal sinus rhythm QRS Axis: normal Intervals: normal ST/T Wave abnormalities: normal Conduction Disturbances: none Narrative Interpretation: unremarkable  ____________________________________________  RADIOLOGY   Dg Chest 2 View  Result Date: 02/28/2017 CLINICAL DATA:  Chest pain, right arm numbness, smoking history EXAM: CHEST  2 VIEW COMPARISON:  Chest x-ray of 02/24/2015 FINDINGS: No active infiltrate or effusion is seen. There may be minimal linear atelectasis at the lung bases. Mediastinal and hilar contours are unremarkable. The heart is within normal limits in size. No bony abnormality is seen. IMPRESSION: No active cardiopulmonary disease. Possible minimal atelectasis at the lung bases. Electronically Signed   By: Dwyane Dee M.D.   On: 02/28/2017 13:23     ____________________________________________   PROCEDURES  Procedure(s) performed:   Procedures   Critical Care performed: No ____________________________________________   INITIAL IMPRESSION / ASSESSMENT AND PLAN / ED COURSE  Pertinent labs & imaging results that were available during my care of the patient were reviewed by me and considered in my medical decision making (see chart for details).  PERC negative, HEART score zero.  Reassuring workup with no significant lab nor radiographic abnormalities.  Very low risk and she is quite comfortable watching TV at this time.  No indication for repeat imaging.  I encourage close outpatient follow-up as well as the use of an over-the-counter PPI because I believe her symptoms may be esophageal.  I do not have a good/for why she would be having numbness or tingling in her right arm and right leg but there is no indication of CVA or TIA and she has had no focal weakness.  Her muscle strength is normal and strong throughout and she states she has had these symptoms many times in the past.  I encourage close outpatient follow-up.  I gave my usual and customary return precautions.         ____________________________________________  FINAL CLINICAL IMPRESSION(S) /  ED DIAGNOSES  Final diagnoses:  Atypical chest pain  Paresthesia     MEDICATIONS GIVEN DURING THIS VISIT:  Medications - No data to display   NEW OUTPATIENT MEDICATIONS STARTED DURING THIS VISIT:  New Prescriptions   OMEPRAZOLE (PRILOSEC OTC) 20 MG TABLET    Take 1 tablet (20 mg total) by mouth daily.    Modified Medications   No medications on file    Discontinued Medications   No medications on file     Note:  This document was prepared using Dragon voice recognition software and may include unintentional dictation errors.    Loleta Rose, MD 02/28/17 [phone]

## 2017-02-28 NOTE — ED Triage Notes (Signed)
Pt comes into the ED via EMS from home with c/o epigastric/chest pain today , pt states she took ASA324mg  PTA, pt has a Hx of crohns. Pt is in NAD at present.

## 2017-02-28 NOTE — ED Notes (Signed)
ED Provider at bedside. 

## 2017-02-28 NOTE — ED Notes (Addendum)
Cp that began this AM without exertion, numbness to right arm and leg. Pt given ASA with EMS. Pt alert and oriented X4, active, cooperative, pt in NAD. RR even and unlabored, color WNL.  Slight chest pain still persists. Pt mother called inquiring about information on patient. Did not give out any information regarding patient. Offered for patient to call mother back, pt declines.

## 2017-02-28 NOTE — Discharge Instructions (Signed)
As we discussed, your workup today was reassuring.  Though we do not know exactly what is causing your symptoms, it appears that you have no emergent medical condition at this time and that you are safe to go home and follow up as recommended in this paperwork.  We do encourage that you take an over-the-counter medication such as Prilosec or Nexium to see if this will help resolve her symptoms.  It may be the result of acid reflux.  Please try this medication for at least a week to see if it helps at all.  Please return immediately to the Emergency Department if you develop any new or worsening symptoms that concern you.

## 2017-03-25 IMAGING — CT CT RENAL STONE PROTOCOL
2 of 4 series · 17 of 46 positions shown, 19 images · non-contrast
Comparison: 10/06/2014

CLINICAL DATA: Right lower quadrant pain 2 days. History of kidney
stones.

EXAM:
CT ABDOMEN AND PELVIS WITHOUT CONTRAST
TECHNIQUE: Multidetector CT imaging of the abdomen and pelvis was performed
following the standard protocol without IV contrast.

[Series 2: soft tissue · axial · 0.68mm/px · z∈[-856,-410]mm · 14 of 99 slices shown, 16 images]
[im 5/99  soft-tissue]
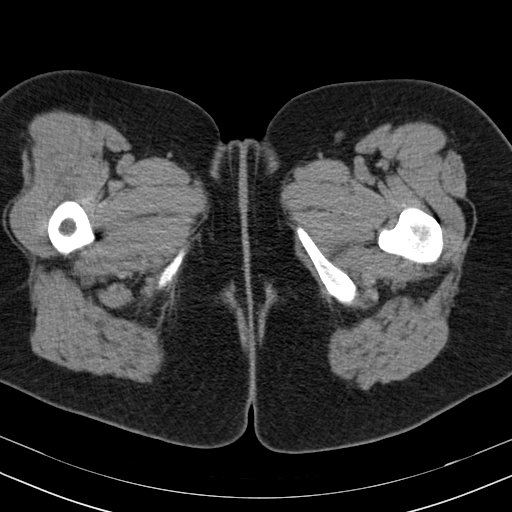
[im 5/99  bone]
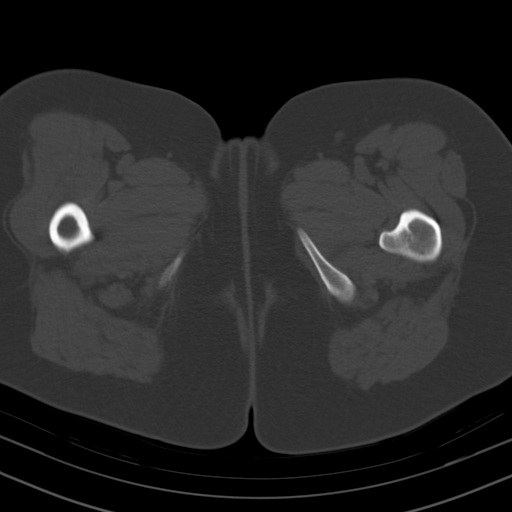
[im 13/99  soft-tissue]
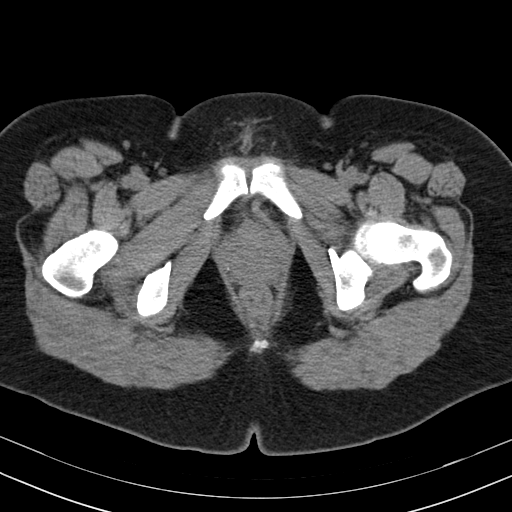
[im 21/99  soft-tissue]
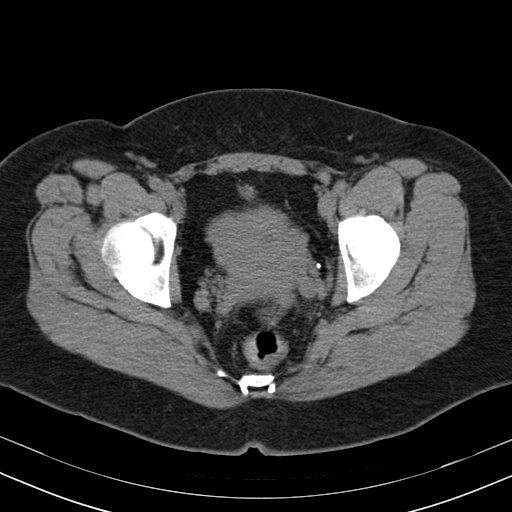
[im 25/99  soft-tissue]
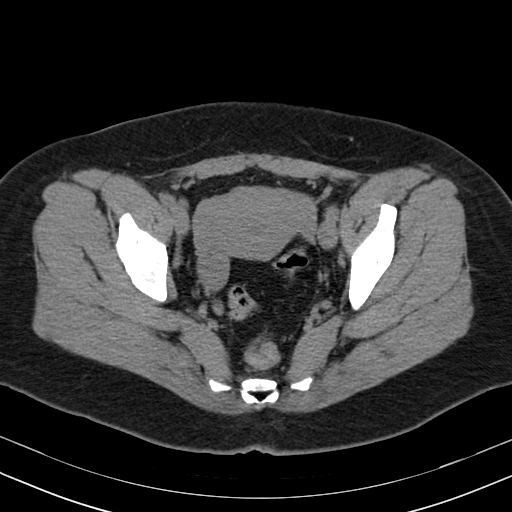
[im 33/99  soft-tissue]
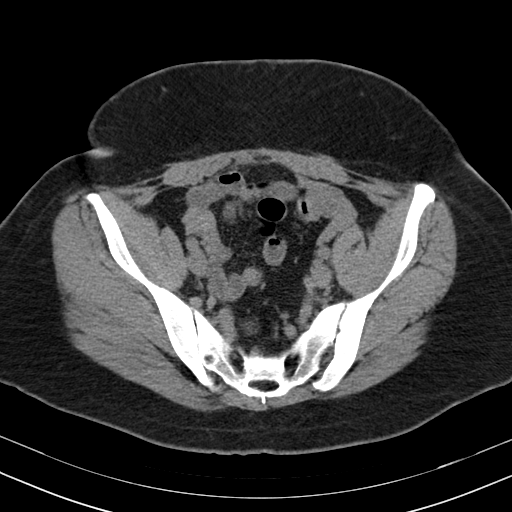
[im 41/99  soft-tissue]
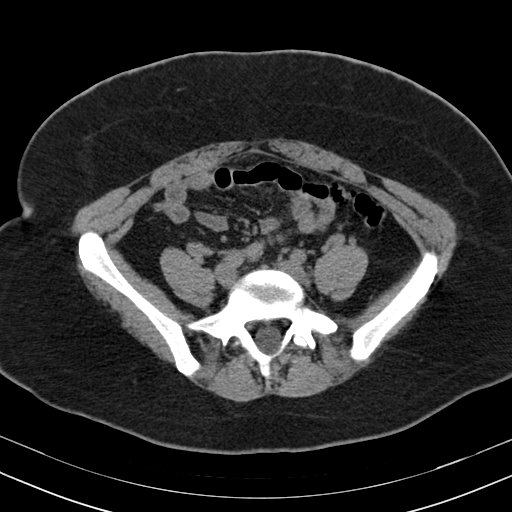
[im 45/99  soft-tissue]
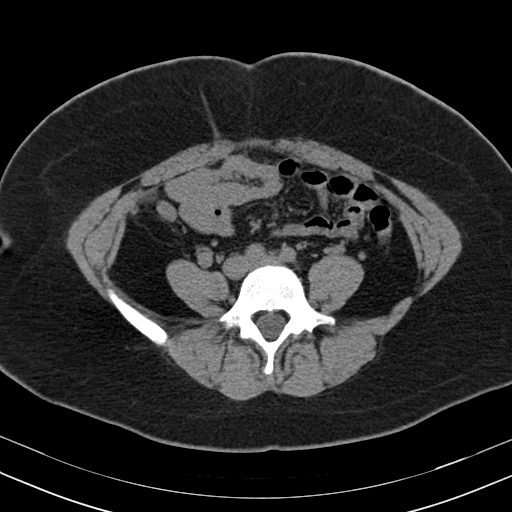
[im 54/99  soft-tissue]
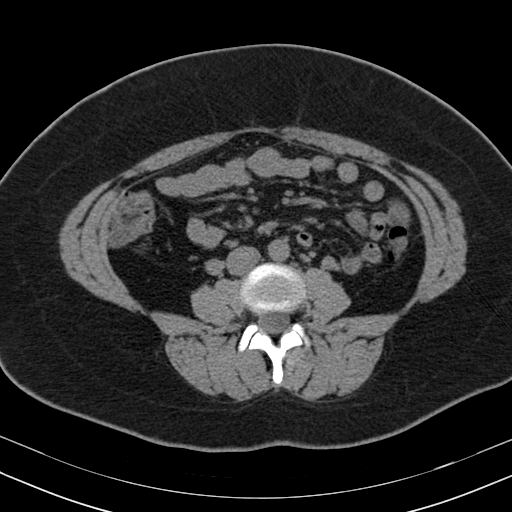
[im 58/99  soft-tissue]
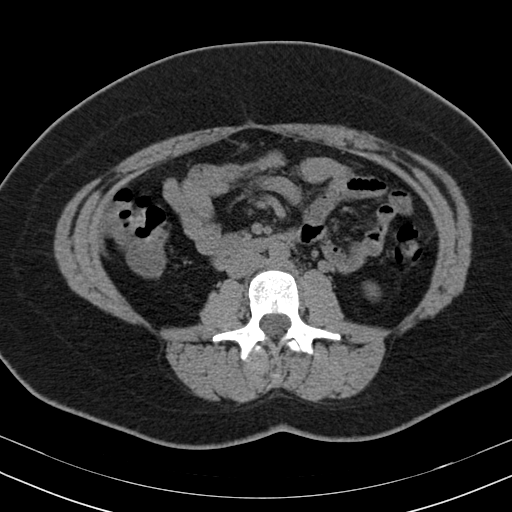
[im 58/99  bone]
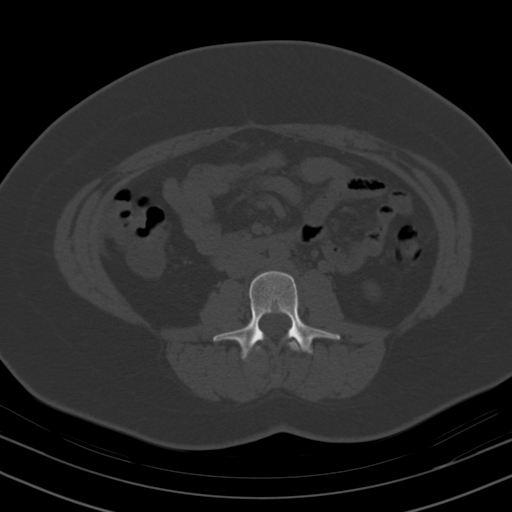
[im 66/99  soft-tissue]
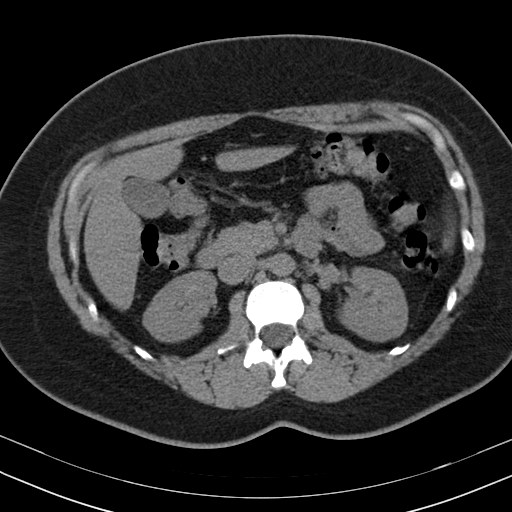
[im 74/99  soft-tissue]
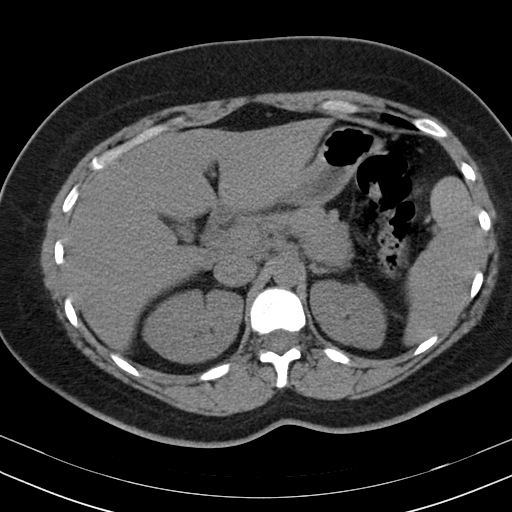
[im 78/99  soft-tissue]
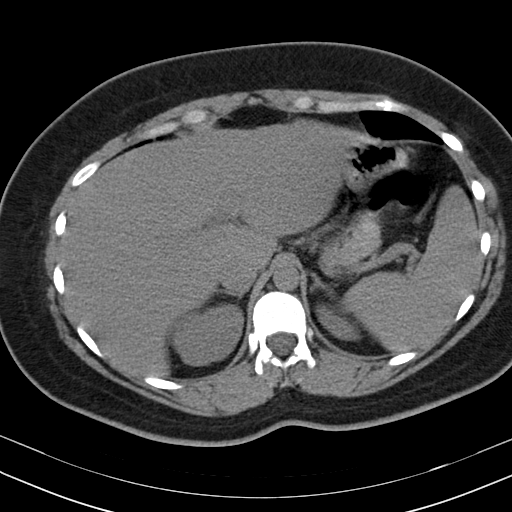
[im 86/99  soft-tissue]
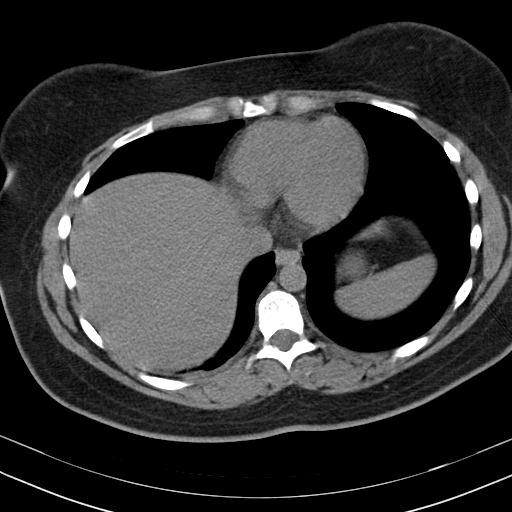
[im 94/99  soft-tissue]
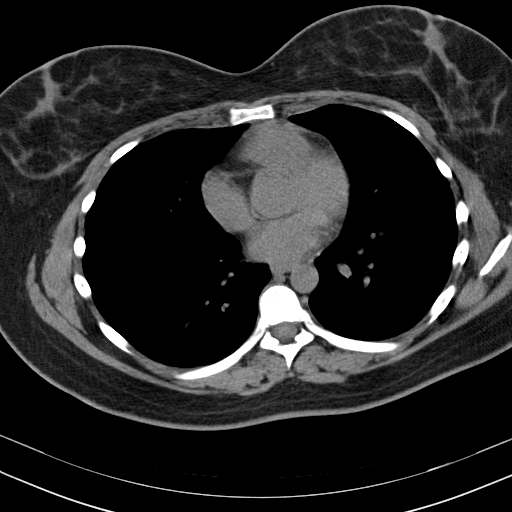

[Series 602: coronal · coronal · 0.96mm/px · 3 of 99 slices shown]
[im 33/99  soft-tissue]
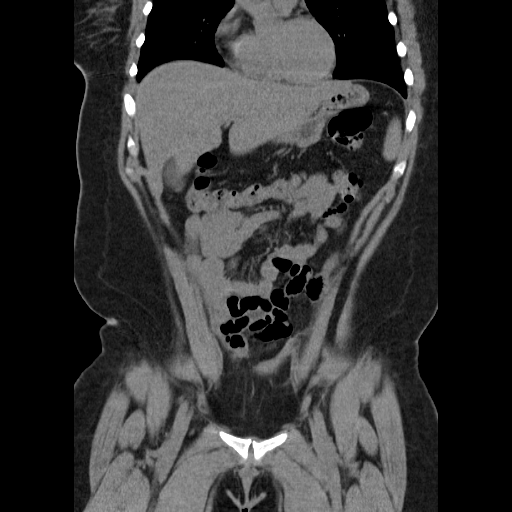
[im 44/99  soft-tissue]
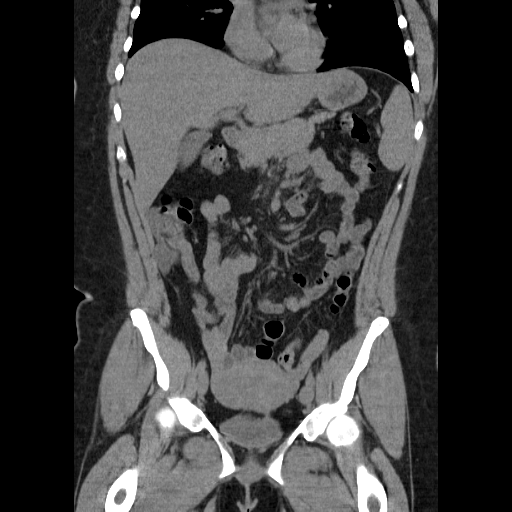
[im 55/99  soft-tissue]
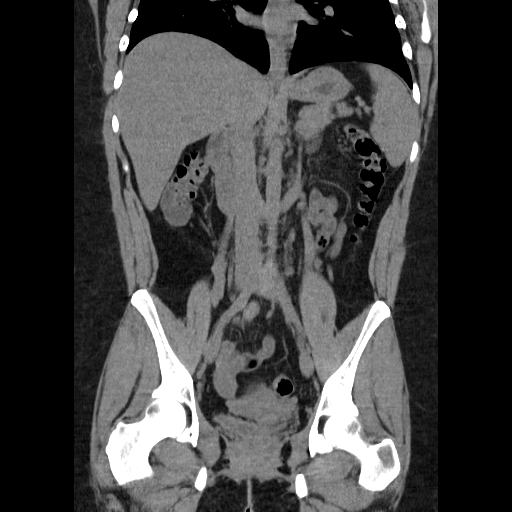

[17 of 46 positions shown; findings below may reference images not displayed]

FINDINGS: Lung bases are within normal.

Abdominal images demonstrate a normal spleen, pancreas, liver,
gallbladder and adrenal glands. The stomach is within normal.

Kidneys are normal in size without hydronephrosis or
nephrolithiasis. Ureters are within normal.

Appendix is normal. Colon small bowel are within normal. There is no
evidence of free fluid or focal inflammatory change.

Vascular structures are normal. Retro aortic left renal vein is
present.

Pelvic images demonstrate a normal bladder, uterus, ovaries and
rectum. No free pelvic fluid. Remaining bones and soft tissues are
within normal.
IMPRESSION: No acute findings in the abdomen/pelvis. No renal/ ureteral stones
or obstruction.

## 2018-01-06 ENCOUNTER — Other Ambulatory Visit: Payer: Self-pay

## 2018-01-06 ENCOUNTER — Emergency Department
Admission: EM | Admit: 2018-01-06 | Discharge: 2018-01-07 | Disposition: A | Discharge status: Other Acute Inpatient Hospital | Payer: Medicaid Other | Attending: Emergency Medicine | Admitting: Emergency Medicine

## 2018-01-06 ENCOUNTER — Encounter: Payer: Self-pay | Admitting: Emergency Medicine

## 2018-01-06 DIAGNOSIS — R110 Nausea: Secondary | ICD-10-CM | POA: Diagnosis not present

## 2018-01-06 DIAGNOSIS — R51 Headache: Secondary | ICD-10-CM | POA: Insufficient documentation

## 2018-01-06 DIAGNOSIS — F3160 Bipolar disorder, current episode mixed, unspecified: Secondary | ICD-10-CM | POA: Insufficient documentation

## 2018-01-06 DIAGNOSIS — Z91040 Latex allergy status: Secondary | ICD-10-CM | POA: Insufficient documentation

## 2018-01-06 DIAGNOSIS — F329 Major depressive disorder, single episode, unspecified: Secondary | ICD-10-CM | POA: Diagnosis not present

## 2018-01-06 DIAGNOSIS — R630 Anorexia: Secondary | ICD-10-CM | POA: Diagnosis not present

## 2018-01-06 DIAGNOSIS — Z91010 Allergy to peanuts: Secondary | ICD-10-CM | POA: Insufficient documentation

## 2018-01-06 DIAGNOSIS — Z79899 Other long term (current) drug therapy: Secondary | ICD-10-CM | POA: Insufficient documentation

## 2018-01-06 DIAGNOSIS — Z008 Encounter for other general examination: Secondary | ICD-10-CM | POA: Diagnosis present

## 2018-01-06 DIAGNOSIS — Z885 Allergy status to narcotic agent status: Secondary | ICD-10-CM | POA: Diagnosis not present

## 2018-01-06 DIAGNOSIS — Z87891 Personal history of nicotine dependence: Secondary | ICD-10-CM | POA: Insufficient documentation

## 2018-01-06 LAB — COMPREHENSIVE METABOLIC PANEL
ALT: 16 U/L (ref 14–54)
AST: 26 U/L (ref 15–41)
Albumin: 4.4 g/dL (ref 3.5–5.0)
Alkaline Phosphatase: 83 U/L (ref 38–126)
Anion gap: 10 (ref 5–15)
BUN: 8 mg/dL (ref 6–20)
CO2: 24 mmol/L (ref 22–32)
Calcium: 9.4 mg/dL (ref 8.9–10.3)
Chloride: 103 mmol/L (ref 101–111)
Creatinine, Ser: 0.92 mg/dL (ref 0.44–1.00)
GFR calc Af Amer: >60 mL/min (ref >60)
GFR calc non Af Amer: >60 mL/min (ref >60)
Glucose, Bld: 142 mg/dL — ABNORMAL HIGH (ref 65–99)
Potassium: 3.5 mmol/L (ref 3.5–5.1)
Sodium: 137 mmol/L (ref 135–145)
Total Bilirubin: 0.7 mg/dL (ref 0.3–1.2)
Total Protein: 7.8 g/dL (ref 6.5–8.1)

## 2018-01-06 LAB — URINE DRUG SCREEN, QUALITATIVE (ARMC ONLY)
Amphetamines, Ur Screen: NOT DETECTED
Barbiturates, Ur Screen: NOT DETECTED
Benzodiazepine, Ur Scrn: NOT DETECTED
Cannabinoid 50 Ng, Ur ~~LOC~~: POSITIVE — AB
Cocaine Metabolite,Ur ~~LOC~~: NOT DETECTED
MDMA (Ecstasy)Ur Screen: NOT DETECTED
Methadone Scn, Ur: NOT DETECTED
Opiate, Ur Screen: NOT DETECTED
Phencyclidine (PCP) Ur S: NOT DETECTED
Tricyclic, Ur Screen: NOT DETECTED

## 2018-01-06 LAB — CBC
HCT: 44.2 % (ref 35.0–47.0)
Hemoglobin: 15.4 g/dL (ref 12.0–16.0)
MCH: 30.7 pg (ref 26.0–34.0)
MCHC: 34.9 g/dL (ref 32.0–36.0)
MCV: 88 fL (ref 80.0–100.0)
Platelets: 230 10*3/uL (ref 150–440)
RBC: 5.02 MIL/uL (ref 3.80–5.20)
RDW: 13.2 % (ref 11.5–14.5)
WBC: 5.1 10*3/uL (ref 3.6–11.0)

## 2018-01-06 LAB — ETHANOL: Alcohol, Ethyl (B): <10 mg/dL (ref <10)

## 2018-01-06 LAB — SALICYLATE LEVEL: Salicylate Lvl: <7 mg/dL (ref 2.8–30.0)

## 2018-01-06 LAB — ACETAMINOPHEN LEVEL: Acetaminophen (Tylenol), Serum: <10 ug/mL — ABNORMAL LOW (ref 10–30)

## 2018-01-06 NOTE — ED Notes (Signed)
SOC called for consult  1201

## 2018-01-06 NOTE — BH Assessment (Addendum)
  Referral information for Psychiatric Hospitalization faxed to;              [facility] ([phone]) [phone] *  . [place] [phone]), *  . [place] [phone] or [phone] *  . [facility] [phone]), *              [place]              [facility]

## 2018-01-06 NOTE — ED Notes (Signed)
SOC MD on line with patient.

## 2018-01-06 NOTE — ED Notes (Signed)
Pt  calm and cooperative with staff. Pt nervous upon admission to [facility].  RN explained to pt she is still voluntary and this is another section of the ER for patients that have been medically cleared. Pt expressed understanding.  Pt denies SI/HI and AVH. Pt stated she was diagnosed with bipolar disorder, but has been off mediations for 8 years. "I just need to get myself together."  Pt stated she has only received outpatient treatment.  "I have never checked myself in anywhere before."   Maintained on 15 minute checks and observation by security camera for safety.

## 2018-01-06 NOTE — ED Notes (Signed)
First Nurse Note: Pt ambulatory into ED c/o mental health issues. Pt reports hx/o Bipolar, states that she has not been on medications in over 8 years. Pt denies SI/HI at this time.

## 2018-01-06 NOTE — ED Notes (Addendum)
Patient changed and dress out in triage.  Clothing placed in patient belonging bag.  Patient also has a black and tan floral cloth bag and stuffed frog.  Belongings labeled with patient name and given to care nurse.

## 2018-01-06 NOTE — ED Notes (Signed)
SOC placed in patient's room and awaiting assessment.

## 2018-01-06 NOTE — BH Assessment (Signed)
Per [facility] BMU "Rounding" Attending Physician (Dr. Isbell) the unit census will be holding at 22 till further notice. Thus, patient is being referred out. 

## 2018-01-06 NOTE — ED Provider Notes (Signed)
[facility] Emergency Department Provider Note ____________________________________________   First MD Initiated Contact with Patient 01/06/18 0820     (approximate)  I have reviewed the triage vital signs and the nursing notes.   HISTORY  Chief Complaint Medical Clearance    HPI Heidi Hudson is a 32 y.o. female with past medical history as noted below who presents for psychiatric evaluation, with a complaint of being in a manic episode.  She reports onset is acute and occurring over the last few days.  She states that her symptoms worsened after her husband told her that he was no longer in love with her because of her behavior, and she states it is associated with not sleeping and not eating over the last several days.  The patient states she has been diagnosed with bipolar disorder, and used to be on multiple medications but was off of them for several years due to insurance issues.  She states she now is insured under her husband and wants to start getting help again.  She reports one prior hospitalization several years ago for postpartum depression, but no other psychiatric hospitalizations.  She states she is not on any medication currently.  She denies SI or HI, but she states that she does feel hopeless and depressed and thinks that if things get worse she may develop SI.  She reports marijuana and alcohol use but states she has not used in the last 2 weeks in an attempt to get clean on her own.  She denies any medical complaints at this time other than nausea and mild headache which she associates with not eating much over the last several days.   Past Medical History:  Diagnosis Date  . Bipolar disorder (HCC)   . Chronic headaches   . History of gestational diabetes   . Psychogenic nonepileptic seizure     There are no active problems to display for this patient.   Past Surgical History:  Procedure Laterality Date  . CESAREAN SECTION     . TUBAL LIGATION      Prior to Admission medications   Medication Sig Start Date End Date Taking? Authorizing Provider  benzonatate (TESSALON) 200 MG capsule Take 1 capsule (200 mg total) by mouth 3 (three) times daily as needed for cough. Patient not taking: Reported on 04/05/2016 02/05/16   Lutricia Feil, PA-C  ciprofloxacin (CIPRO) 500 MG tablet Take 1 tablet (500 mg total) by mouth 2 (two) times daily. Patient not taking: Reported on 04/05/2016 02/11/16   Lutricia Feil, PA-C  omeprazole (PRILOSEC OTC) 20 MG tablet Take 1 tablet (20 mg total) by mouth daily. 02/28/17 02/28/18  Loleta Rose, MD    Allergies Hydrocodone; Latex; and Peanut-containing drug products  Family History  Problem Relation Age of Onset  . Hypertension Mother     Social History Social History   Tobacco Use  . Smoking status: Former Games developer  . Smokeless tobacco: Never Used  Substance Use Topics  . Alcohol use: No  . Drug use: No    Review of Systems  Constitutional: No fever. Eyes: No redness. ENT: No sore throat. Cardiovascular: Denies chest pain. Respiratory: Denies shortness of breath. Gastrointestinal: Positive for nausea.  Genitourinary: Negative for dysuria.  Musculoskeletal: Negative for back pain. Skin: Negative for rash. Neurological: Positive for mild headache.   ____________________________________________   PHYSICAL EXAM:  VITAL SIGNS: ED Triage Vitals [01/06/18 0806]  Enc Vitals Group     BP 128/86  Pulse Rate 97     Resp 16     Temp 98 F (36.7 C)     Temp Source Oral     SpO2 96 %     Weight 160 lb (72.6 kg)     Height 5\' 4"  (1.626 m)     Head Circumference      Peak Flow      Pain Score 0     Pain Loc      Pain Edu?      Excl. in GC?     Constitutional: Alert and oriented. Well appearing and in no acute distress. Eyes: Conjunctivae are normal.  EOMI. Head: Atraumatic. Nose: No congestion/rhinnorhea. Mouth/Throat: Mucous membranes are moist.   Neck:  Normal range of motion.  Cardiovascular:  Good peripheral circulation. Respiratory: Normal respiratory effort. Gastrointestinal:  No distention.  Musculoskeletal: Extremities warm and well perfused.  Neurologic:  Normal speech and language. No gross focal neurologic deficits are appreciated.  Skin:  Skin is warm and dry. No rash noted. Psychiatric: Mood and affect are normal. Speech and behavior are normal.  ____________________________________________   LABS (all labs ordered are listed, but only abnormal results are displayed)  Labs Reviewed  COMPREHENSIVE METABOLIC PANEL - Abnormal; Notable for the following components:      Result Value   Glucose, Bld 142 (*)    All other components within normal limits  ACETAMINOPHEN LEVEL - Abnormal; Notable for the following components:   Acetaminophen (Tylenol), Serum <10 (*)    All other components within normal limits  URINE DRUG SCREEN, QUALITATIVE ([facility] ONLY) - Abnormal; Notable for the following components:   Cannabinoid 50 Ng, Ur [place] POSITIVE (*)    All other components within normal limits  ETHANOL  SALICYLATE LEVEL  CBC  POC URINE PREG, ED   ____________________________________________  EKG   ____________________________________________  RADIOLOGY    ____________________________________________   PROCEDURES  Procedure(s) performed: No    Critical Care performed: No ____________________________________________   INITIAL IMPRESSION / ASSESSMENT AND PLAN / ED COURSE  Pertinent labs & imaging results that were available during my care of the patient were reviewed by me and considered in my medical decision making (see chart for details).  32 year old female with history of bipolar disorder who is been off medication and not seeing a psychiatrist or therapist for the last several years presents stating that she is having a manic episode and asking for help.  She states that her visit today was precipitated by a  conflict with her husband over her mental health, as well as the fact that she now is insured and is able to potentially be covered for treatment.  The patient denies any acute medical complaints.  Her vital signs are normal and her physical exam is unremarkable.  I reviewed the past medical records in epic, and the patient has several ED visits over the last few years for medical complaints but no recent documented psychiatric history.  At this time the patient denies SI or HI.  She is here voluntarily and there is no evidence of danger to self or others acutely.  Differential includes bipolar disorder with mania, versus anxiety.  Plan: Labs for medical clearance, [facility] consult and reassess.  ----------------------------------------- 2:00 PM on 01/06/2018 -----------------------------------------  Medical workup is unremarkable.  SOC recommends inpatient admission.  Patient will be transferred to the [facility].  ____________________________________________   FINAL CLINICAL IMPRESSION(S) / ED DIAGNOSES  Final diagnoses:  Bipolar disorder, mixed (HCC)  NEW MEDICATIONS STARTED DURING THIS VISIT:  New Prescriptions   No medications on file     Note:  This document was prepared using Dragon voice recognition software and may include unintentional dictation errors.     Dionne BucySiadecki, Dejai Schubach, MD 01/06/18 1400

## 2018-01-06 NOTE — ED Notes (Signed)
Meal tray given to patient.

## 2018-01-06 NOTE — BH Assessment (Signed)
Assessment Note  Heidi Hudson is an 32 y.o. female who presented to [facility] ED voluntarily because she is wanting to "better myself and not feel the way I feel anymore".  Pt. denies SI/HI, AVH.  Pt. denies substance use.  Pt. reports she is depressed and has been having crying spells. Pt. also reports that she has not slept in 5 days and does not have an appetite, therefore has not been eating.  Pt. lives in the home with her husband and children.  Pt. denies outpatient treatment, but reports a previous inpatient hospitalization when she was 32 y/o due to a suicidal attempt.  Pt. states that the last time she has been on medication was 8 years ago, and wants to get back on medication at this time.   Pt. denies a criminal history. Pt. is current being employed part-time by [employer].  Diagnosis: Bipolar Disorder   Past Medical History:  Past Medical History:  Diagnosis Date  . Bipolar disorder (HCC)   . Chronic headaches   . History of gestational diabetes   . Psychogenic nonepileptic seizure     Past Surgical History:  Procedure Laterality Date  . CESAREAN SECTION    . TUBAL LIGATION      Family History:  Family History  Problem Relation Age of Onset  . Hypertension Mother     Social History:  reports that she has quit smoking. she has never used smokeless tobacco. She reports that she does not drink alcohol or use drugs.  Additional Social History:  Alcohol / Drug Use Pain Medications: See MAR Prescriptions: See MAR Over the Counter: See MAR History of alcohol / drug use?: No history of alcohol / drug abuse  CIWA: CIWA-Ar BP: 110/78 Pulse Rate: 70 COWS:    Allergies:  Allergies  Allergen Reactions  . Hydrocodone Hives and Nausea And Vomiting  . Latex Hives and Swelling  . Peanut-Containing Drug Products     Home Medications:  (Not in a hospital admission)  OB/GYN Status:  Patient's last menstrual period was 01/03/2018.  General Assessment Data Location of  Assessment: [facility] ED TTS Assessment: In system Is this a Tele or Face-to-Face Assessment?: Face-to-Face Is this an Initial Assessment or a Re-assessment for this encounter?: Initial Assessment Marital status: Married MotleyMaiden name: Engineer, productionBaker Is patient pregnant?: No Pregnancy Status: No Living Arrangements: Spouse/significant other Can pt return to current living arrangement?: Yes Admission Status: Voluntary Is patient capable of signing voluntary admission?: Yes Referral Source: Self/Family/Friend Insurance type: [place] Medicaid  Medical Screening Exam [facility] Walk-in ONLY) Medical Exam completed: Yes  Crisis Care Plan Living Arrangements: Spouse/significant other Name of Psychiatrist: None Name of Therapist: None  Education Status Is patient currently in school?: No  Risk to self with the past 6 months Suicidal Ideation: No Has patient been a risk to self within the past 6 months prior to admission? : No Suicidal Intent: No Has patient had any suicidal intent within the past 6 months prior to admission? : No Is patient at risk for suicide?: No Suicidal Plan?: No Has patient had any suicidal plan within the past 6 months prior to admission? : No Access to Means: No What has been your use of drugs/alcohol within the last 12 months?: N/A Previous Attempts/Gestures: No How many times?: 0 Other Self Harm Risks: N/A Triggers for Past Attempts: None known Intentional Self Injurious Behavior: None Family Suicide History: No Recent stressful life event(s): Other (Comment) Persecutory voices/beliefs?: No Depression: Yes Depression Symptoms: Tearfulness,  Insomnia, Feeling worthless/self pity Substance abuse history and/or treatment for substance abuse?: No Suicide prevention information given to non-admitted patients: Yes  Risk to Others within the past 6 months Homicidal Ideation: No Does patient have any lifetime risk of violence toward others beyond the six months prior to admission? :  No Thoughts of Harm to Others: No Current Homicidal Intent: No Current Homicidal Plan: No Access to Homicidal Means: No Identified Victim: N/A History of harm to others?: No Assessment of Violence: None Noted Violent Behavior Description: N/A Does patient have access to weapons?: No Criminal Charges Pending?: No Does patient have a court date: No Is patient on probation?: No  Psychosis Hallucinations: None noted Delusions: None noted  Mental Status Report Appearance/Hygiene: In scrubs Eye Contact: Good Motor Activity: Unremarkable Speech: Unremarkable Level of Consciousness: Quiet/awake Mood: Depressed, Sad, Worthless, low self-esteem Affect: Sad Anxiety Level: Minimal Thought Processes: Relevant, Coherent Judgement: Unimpaired Orientation: Appropriate for developmental age Obsessive Compulsive Thoughts/Behaviors: None  Cognitive Functioning Concentration: Normal Memory: Recent Intact IQ: Average Insight: Fair Impulse Control: Fair Appetite: Poor Weight Loss: 0 Weight Gain: 0 Sleep: Decreased Total Hours of Sleep: 5(states has not slept in 5 days, but typically sleeps 5 hrs) Vegetative Symptoms: None  ADLScreening [facility] Assessment Services) Patient's cognitive ability adequate to safely complete daily activities?: Yes Patient able to express need for assistance with ADLs?: Yes Independently performs ADLs?: Yes (appropriate for developmental age)  Prior Inpatient Therapy Prior Inpatient Therapy: Yes Prior Therapy Dates: (During teens) Prior Therapy Facilty/Provider(s): (Pt. does not remember, states it was in [place]) Reason for Treatment: suicidal  Prior Outpatient Therapy Prior Outpatient Therapy: No Prior Therapy Dates: N/A Prior Therapy Facilty/Provider(s): N/A Reason for Treatment: N/A Does patient have an ACCT team?: No Does patient have Intensive In-House Services?  : No Does patient have [facility] services? : No Does patient have P4CC services?:  No  ADL Screening (condition at time of admission) Patient's cognitive ability adequate to safely complete daily activities?: Yes Is the patient deaf or have difficulty hearing?: No Does the patient have difficulty seeing, even when wearing glasses/contacts?: No Does the patient have difficulty concentrating, remembering, or making decisions?: No Patient able to express need for assistance with ADLs?: Yes Does the patient have difficulty dressing or bathing?: No Independently performs ADLs?: Yes (appropriate for developmental age) Does the patient have difficulty walking or climbing stairs?: No Weakness of Legs: None Weakness of Arms/Hands: None  Home Assistive Devices/Equipment Home Assistive Devices/Equipment: None  Therapy Consults (therapy consults require a physician order) PT Evaluation Needed: No OT Evalulation Needed: No SLP Evaluation Needed: No Abuse/Neglect Assessment (Assessment to be complete while patient is alone) Abuse/Neglect Assessment Can Be Completed: Yes Physical Abuse: Denies Verbal Abuse: Denies Sexual Abuse: Denies Exploitation of patient/patient's resources: Denies Self-Neglect: Denies Values / Beliefs Cultural Requests During Hospitalization: None Spiritual Requests During Hospitalization: None Consults Spiritual Care Consult Needed: No Social Work Consult Needed: No Merchant navy officer (For Healthcare) Does Patient Have a Medical Advance Directive?: No Would patient like information on creating a medical advance directive?: No - Patient declined    Additional Information 1:1 In Past 12 Months?: No CIRT Risk: No Elopement Risk: No Does patient have medical clearance?: Yes     Disposition:  Disposition Initial Assessment Completed for this Encounter: Yes Disposition of Patient: Pending Review with psychiatrist  On Site Evaluation by:   Reviewed with Physician:    Ellery Plunk, MSW, [facility]  01/06/2018 5:39 PM

## 2018-01-06 NOTE — ED Triage Notes (Signed)
Patient states she was been diagnosed with Bipolar Depression at the age of 32.  States she has been off of medication x 8 years (had been taking Depakote, Lithium, Xanax).  States "My mind is all over the place.  Sad, emotional, hopeless.  Just not the person I know I can be".  States symptoms have been ongoing x 1 month.  Denies SI/HI.  States has not slept in 5 days.  Has not eaten in 3 days.  Lives at home with husband and children.

## 2018-01-07 NOTE — BH Assessment (Signed)
Writer received phone call from [facility] ([phone]) about patient and was willing to offer a bed, contingent she be placed under IVC.  Writer followed up with the other hospitals she was referred to and [facility] offered a bed and she can remain voluntary. Writer spoke with the patient and informed her of the bed offer and she's in agreement with transferring to [facility]. Writer provided her with information about the facility. She stated she was going to contact her family and give them the information.

## 2018-01-07 NOTE — ED Notes (Signed)
Pt. Alert and oriented, warm and dry, in no distress. Pt. Denies SI, HI, and AVH. Pt. Encouraged to let nursing staff know of any concerns or needs. 

## 2018-01-07 NOTE — ED Notes (Signed)

## 2018-01-07 NOTE — ED Provider Notes (Signed)
-----------------------------------------   7:09 AM on 01/07/2018 -----------------------------------------   Blood pressure (!) 100/59, pulse (!) 16, temperature 98.2 F (36.8 C), temperature source Oral, resp. rate 16, height 5\' 4"  (1.626 m), weight 72.6 kg (160 lb), last menstrual period 01/03/2018, SpO2 98 %.  The patient had no acute events since last update.  Calm and cooperative at this time.  Disposition is pending Psychiatry/Behavioral Medicine team recommendations.     Myrna BlazerSchaevitz, Ruble Buttler Matthew, MD 01/07/18 [phone]

## 2018-01-07 NOTE — BH Assessment (Signed)
Patient has been accepted to [facility].  Accepting physician is Dr. Marciano Sequinaniel Partin.  Call report to [phone].  Representative was [employer].  ER Staff is aware of it Misty Stanley(Lisa, ER Sect.; Dr. Pershing ProudSchaevitz, ER MD & Lincoln Maxinlivette, Patient's Nurse) Due to being voluntarily she will transport via [facility] [employer].  Address:  [address] [place], [place] [address]

## 2018-06-01 ENCOUNTER — Emergency Department: Payer: Medicaid Other

## 2018-06-01 ENCOUNTER — Emergency Department
Admission: EM | Admit: 2018-06-01 | Discharge: 2018-06-01 | Disposition: A | Discharge status: Home / Self Care | Payer: Medicaid Other | Attending: Emergency Medicine | Admitting: Emergency Medicine

## 2018-06-01 ENCOUNTER — Encounter: Payer: Self-pay | Admitting: Emergency Medicine

## 2018-06-01 ENCOUNTER — Other Ambulatory Visit: Payer: Self-pay

## 2018-06-01 DIAGNOSIS — R112 Nausea with vomiting, unspecified: Secondary | ICD-10-CM | POA: Diagnosis not present

## 2018-06-01 DIAGNOSIS — Z3202 Encounter for pregnancy test, result negative: Secondary | ICD-10-CM | POA: Insufficient documentation

## 2018-06-01 DIAGNOSIS — B9689 Other specified bacterial agents as the cause of diseases classified elsewhere: Secondary | ICD-10-CM | POA: Diagnosis not present

## 2018-06-01 DIAGNOSIS — Z87891 Personal history of nicotine dependence: Secondary | ICD-10-CM | POA: Insufficient documentation

## 2018-06-01 DIAGNOSIS — N760 Acute vaginitis: Secondary | ICD-10-CM | POA: Insufficient documentation

## 2018-06-01 DIAGNOSIS — N938 Other specified abnormal uterine and vaginal bleeding: Secondary | ICD-10-CM | POA: Insufficient documentation

## 2018-06-01 DIAGNOSIS — Z91040 Latex allergy status: Secondary | ICD-10-CM | POA: Diagnosis not present

## 2018-06-01 DIAGNOSIS — R102 Pelvic and perineal pain: Secondary | ICD-10-CM | POA: Diagnosis not present

## 2018-06-01 DIAGNOSIS — R109 Unspecified abdominal pain: Secondary | ICD-10-CM | POA: Diagnosis present

## 2018-06-01 LAB — URINALYSIS, COMPLETE (UACMP) WITH MICROSCOPIC
Bacteria, UA: NONE SEEN
Bilirubin Urine: NEGATIVE
Glucose, UA: NEGATIVE mg/dL
Hgb urine dipstick: NEGATIVE
Ketones, ur: NEGATIVE mg/dL
Leukocytes, UA: NEGATIVE
Nitrite: NEGATIVE
Protein, ur: NEGATIVE mg/dL
Specific Gravity, Urine: 1.009 (ref 1.005–1.030)
pH: 7 (ref 5.0–8.0)

## 2018-06-01 LAB — HCG, QUANTITATIVE, PREGNANCY: hCG, Beta Chain, Quant, S: 1 m[IU]/mL (ref <5)

## 2018-06-01 LAB — COMPREHENSIVE METABOLIC PANEL
ALT: 17 U/L (ref 14–54)
AST: 21 U/L (ref 15–41)
Albumin: 4.4 g/dL (ref 3.5–5.0)
Alkaline Phosphatase: 67 U/L (ref 38–126)
Anion gap: 8 (ref 5–15)
BUN: 11 mg/dL (ref 6–20)
CO2: 23 mmol/L (ref 22–32)
Calcium: 9.1 mg/dL (ref 8.9–10.3)
Chloride: 105 mmol/L (ref 101–111)
Creatinine, Ser: 0.69 mg/dL (ref 0.44–1.00)
GFR calc Af Amer: >60 mL/min (ref >60)
GFR calc non Af Amer: >60 mL/min (ref >60)
Glucose, Bld: 126 mg/dL — ABNORMAL HIGH (ref 65–99)
Potassium: 4.1 mmol/L (ref 3.5–5.1)
Sodium: 136 mmol/L (ref 135–145)
Total Bilirubin: 0.5 mg/dL (ref 0.3–1.2)
Total Protein: 7.5 g/dL (ref 6.5–8.1)

## 2018-06-01 LAB — WET PREP, GENITAL
Sperm: NONE SEEN
Trich, Wet Prep: NONE SEEN
Yeast Wet Prep HPF POC: NONE SEEN

## 2018-06-01 LAB — CBC
HCT: 43.9 % (ref 35.0–47.0)
Hemoglobin: 15.4 g/dL (ref 12.0–16.0)
MCH: 31.5 pg (ref 26.0–34.0)
MCHC: 35.1 g/dL (ref 32.0–36.0)
MCV: 89.7 fL (ref 80.0–100.0)
Platelets: 249 10*3/uL (ref 150–440)
RBC: 4.89 MIL/uL (ref 3.80–5.20)
RDW: 12.9 % (ref 11.5–14.5)
WBC: 6.4 10*3/uL (ref 3.6–11.0)

## 2018-06-01 LAB — POCT PREGNANCY, URINE: Preg Test, Ur: NEGATIVE

## 2018-06-01 LAB — CHLAMYDIA/NGC RT PCR (ARMC ONLY)

## 2018-06-01 LAB — LIPASE, BLOOD: Lipase: 42 U/L (ref 11–51)

## 2018-06-01 MED ORDER — OXYCODONE-ACETAMINOPHEN 5-325 MG PO TABS
1.0000 | ORAL_TABLET | Freq: Once | ORAL | Status: AC
  Administered 2018-06-01: 1 via ORAL
  Filled 2018-06-01: qty 1

## 2018-06-01 MED ORDER — METRONIDAZOLE 500 MG PO TABS: 500.0000 mg | ORAL_TABLET | Freq: Two times a day (BID) | ORAL | 0 refills | Status: AC

## 2018-06-01 MED ORDER — PROMETHAZINE HCL 25 MG/ML IJ SOLN
12.5000 mg | Freq: Once | INTRAMUSCULAR | Status: DC
  Filled 2018-06-01: qty 1

## 2018-06-01 MED ORDER — ONDANSETRON HCL 4 MG PO TABS
4.0000 mg | ORAL_TABLET | Freq: Once | ORAL | Status: DC
  Filled 2018-06-01: qty 1

## 2018-06-01 NOTE — ED Notes (Signed)
EDP and Dorian, EDT present for pelvic exam.

## 2018-06-01 NOTE — ED Triage Notes (Signed)
States feels like she is going to have a seizure x 1 week. States has smells and vagueness typical of her aura. Also positive pregnancy test one month ago. Has had bleeding x 5 days. States has a tampon now that she cannot retrieve. States lower R abdominal pain 10/10. States had tubal ligation with reversal approx 1 year ago.

## 2018-06-01 NOTE — ED Provider Notes (Signed)
[facility] Emergency Department Provider Note ____________________________________________   First MD Initiated Contact with Patient 06/01/18 1400     (approximate)  I have reviewed the triage vital signs and the nursing notes.   HISTORY  Chief Complaint Abdominal Pain  HPI Salvatore DecentVictoria M Sarno Davis is a 32 y.o. female history of bipolar disorder as well as gestational diabetes who is presenting to the emergency department with right-sided abdominal pain over the past week.  She also reports intermittent vaginal bleeding with clots.  Says that 3 weeks ago she took a pregnancy test which was positive and now has had the pain with bleeding over the past week.  Also with associated nausea and vomiting.  Does not report any burning with urination.  Also thinks she may have left a tampon in her vagina.  Says that she did inserted a tampon last night and then could not find it this morning.  Says that she searched her bed and says that she has a foreign body sensation in the vagina.  Past Medical History:  Diagnosis Date  . Bipolar disorder (HCC)   . Chronic headaches   . History of gestational diabetes   . Psychogenic nonepileptic seizure     There are no active problems to display for this patient.   Past Surgical History:  Procedure Laterality Date  . CESAREAN SECTION    . TUBAL LIGATION      Prior to Admission medications   Medication Sig Start Date End Date Taking? Authorizing Provider  benzonatate (TESSALON) 200 MG capsule Take 1 capsule (200 mg total) by mouth 3 (three) times daily as needed for cough. Patient not taking: Reported on 04/05/2016 02/05/16   Lutricia Feiloemer, William P, PA-C  ciprofloxacin (CIPRO) 500 MG tablet Take 1 tablet (500 mg total) by mouth 2 (two) times daily. Patient not taking: Reported on 04/05/2016 02/11/16   Lutricia Feiloemer, William P, PA-C  omeprazole (PRILOSEC OTC) 20 MG tablet Take 1 tablet (20 mg total) by mouth daily. 02/28/17 02/28/18  Loleta RoseForbach,  Cory, MD    Allergies Hydrocodone and Latex  Family History  Problem Relation Age of Onset  . Hypertension Mother     Social History Social History   Tobacco Use  . Smoking status: Former Games developermoker  . Smokeless tobacco: Never Used  Substance Use Topics  . Alcohol use: No  . Drug use: No    Review of Systems  Constitutional: No fever/chills Eyes: No visual changes. ENT: No sore throat. Cardiovascular: Denies chest pain. Respiratory: Denies shortness of breath. Gastrointestinal:   No diarrhea.  No constipation. Genitourinary: As above Musculoskeletal: Negative for back pain. Skin: Negative for rash. Neurological: Negative for headaches, focal weakness or numbness.   ____________________________________________   PHYSICAL EXAM:  VITAL SIGNS: ED Triage Vitals [06/01/18 1134]  Enc Vitals Group     BP 109/89     Pulse Rate 74     Resp 18     Temp 98.2 F (36.8 C)     Temp Source Oral     SpO2 98 %     Weight 164 lb (74.4 kg)     Height 5\' 4"  (1.626 m)     Head Circumference      Peak Flow      Pain Score 10     Pain Loc      Pain Edu?      Excl. in GC?     Constitutional: Alert and oriented. Well appearing and in no acute distress.  Eyes: Conjunctivae are normal.  Head: Atraumatic. Nose: No congestion/rhinnorhea. Mouth/Throat: Mucous membranes are moist.  Neck: No stridor.   Cardiovascular: Normal rate, regular rhythm. Grossly normal heart sounds.  Good peripheral circulation. Respiratory: Normal respiratory effort.  No retractions. Lungs CTAB. Gastrointestinal: Soft with moderate right upper as well as right lower quadrant tenderness to palpation.  Negative for left-sided tenderness to palpation.. No distention. No CVA tenderness. Genitourinary: Normal external exam without any lesions.  Speculum exam with a varies small stripe of blood from the cervix which is easily removed and there is no active bleeding identified.  I do not see any foreign body  including a tampon on the speculum exam.  Bimanual exam without any tampon palpated.  No CMT.  However, there is bilateral adnexal tenderness as well as suprapubic tenderness to palpation without any masses palpated. Musculoskeletal: No lower extremity tenderness nor edema.  No joint effusions. Neurologic:  Normal speech and language. No gross focal neurologic deficits are appreciated. Skin:  Skin is warm, dry and intact. No rash noted. Psychiatric: Mood and affect are normal. Speech and behavior are normal.  ____________________________________________   LABS (all labs ordered are listed, but only abnormal results are displayed)  Labs Reviewed  WET PREP, GENITAL - Abnormal; Notable for the following components:      Result Value   Clue Cells Wet Prep HPF POC PRESENT (*)    WBC, Wet Prep HPF POC FEW (*)    All other components within normal limits  COMPREHENSIVE METABOLIC PANEL - Abnormal; Notable for the following components:   Glucose, Bld 126 (*)    All other components within normal limits  URINALYSIS, COMPLETE (UACMP) WITH MICROSCOPIC - Abnormal; Notable for the following components:   Color, Urine YELLOW (*)    APPearance HAZY (*)    All other components within normal limits  CHLAMYDIA/NGC RT PCR ([facility] ONLY)  LIPASE, BLOOD  CBC  HCG, QUANTITATIVE, PREGNANCY  POCT PREGNANCY, URINE   ____________________________________________  EKG   ____________________________________________  RADIOLOGY  Pending ultrasound of the pelvis. ____________________________________________   PROCEDURES  Procedure(s) performed:   Procedures  Critical Care performed:   ____________________________________________   INITIAL IMPRESSION / ASSESSMENT AND PLAN / ED COURSE  Pertinent labs & imaging results that were available during my care of the patient were reviewed by me and considered in my medical decision making (see chart for details).  Differential diagnosis  includes, but is not limited to, ovarian cyst, ovarian torsion, acute appendicitis, diverticulitis, urinary tract infection/pyelonephritis, endometriosis, bowel obstruction, colitis, renal colic, gastroenteritis, hernia, fibroids, endometriosis, pregnancy related pain including ectopic pregnancy, etc. As part of my medical decision making, I reviewed the following data within the electronic MEDICAL RECORD NUMBER Notes from prior ED visits  ----------------------------------------- 4:03 PM on 06/01/2018 -----------------------------------------  No foreign body identified in the vagina.  Pending ultrasound of the pelvis at this time.  Signed with Dr. Juliette Alcide.  Patient was found to have bacterial vaginosis.  Will discharge with prescription for Flagyl.  Pregnancy test negative here in the emergency department.  Possibly patient has miscarried. ____________________________________________   FINAL CLINICAL IMPRESSION(S) / ED DIAGNOSES  Final diagnoses:  Pelvic pain  Pelvic pain  Dysfunctional uterine bleeding.  Bacterial vaginosis.    NEW MEDICATIONS STARTED DURING THIS VISIT:  New Prescriptions   No medications on file     Note:  This document was prepared using Dragon voice recognition software and may include unintentional dictation errors.     Milliani Herrada, Myra Rude, MD  06/01/18 1603  

## 2018-06-01 NOTE — ED Triage Notes (Signed)
First Nurse Note:  C/O passing blood clots x 2 weeks and having a positive pregnancy test 3 weeks ago.  C/O feeling dizzy.  AAOx3.  Skin warm and dry. NAD

## 2018-06-01 NOTE — ED Provider Notes (Signed)
patient seen and examined she's had the pain for 7 days today it's been getting better. She has no real bleeding today she still tender on the right side abdomen right upper and lower quadrant but again the pains better than it was ultrasound is essentially negative patient will try some Motrin follow-up with her doctor she'll return if she is worse has worse pain fever vomiting or feels sicker. I doubt that there is any sort of appendicitis or cholecystitis going on after a week. The pain is not worse with food is not worse when she hits bumps in the road or walks. Pelvic exam was essentially negative ultrasound was normal again I will let her go and she will return if she is worse and follow-up with OB/GYN.   Arnaldo NatalMalinda, Greyson Riccardi F, MD 06/01/18 [phone]

## 2018-06-01 NOTE — ED Notes (Signed)
Patient transported to [facility] 

## 2018-06-01 NOTE — Discharge Instructions (Addendum)
please use Motrin 3 or 4 the over-the-counter pills 3 times a day with food. Return for worse pain fever vomiting or feeling sicker please follow-up with your OB/GYN doctor

## 2018-06-01 NOTE — ED Notes (Signed)
Pt ambulatory upon discharge; declined wheel chair. Verbalized understanding of discharge instructions, follow-up care and prescription. VSS. Skin warm and dry. A&O x4.  

## 2018-06-03 ENCOUNTER — Telehealth: Payer: Self-pay | Admitting: Emergency Medicine

## 2018-06-03 NOTE — Telephone Encounter (Signed)
Called patient and informed her that GC/Chlamydia test was not done due to inadequate specimen. Instructed to follow up with her doctor if she is concerned for STDs. Says she is not concerned.

## 2018-07-21 ENCOUNTER — Emergency Department
Admission: EM | Admit: 2018-07-21 | Discharge: 2018-07-21 | Disposition: A | Discharge status: Home / Self Care | Payer: Medicaid Other | Attending: Emergency Medicine | Admitting: Emergency Medicine

## 2018-07-21 ENCOUNTER — Encounter: Payer: Self-pay | Admitting: Gynecology

## 2018-07-21 ENCOUNTER — Encounter: Payer: Self-pay | Admitting: Emergency Medicine

## 2018-07-21 ENCOUNTER — Other Ambulatory Visit: Payer: Self-pay

## 2018-07-21 ENCOUNTER — Emergency Department: Payer: Medicaid Other

## 2018-07-21 ENCOUNTER — Ambulatory Visit
Admission: EM | Admit: 2018-07-21 | Discharge: 2018-07-21 | Disposition: A | Discharge status: Nursing Home (Includes ICF) | Payer: Medicaid Other | Attending: Family Medicine | Admitting: Family Medicine

## 2018-07-21 DIAGNOSIS — R1031 Right lower quadrant pain: Secondary | ICD-10-CM | POA: Insufficient documentation

## 2018-07-21 DIAGNOSIS — R509 Fever, unspecified: Secondary | ICD-10-CM | POA: Diagnosis not present

## 2018-07-21 DIAGNOSIS — Z87891 Personal history of nicotine dependence: Secondary | ICD-10-CM | POA: Diagnosis not present

## 2018-07-21 DIAGNOSIS — Q644 Malformation of urachus: Secondary | ICD-10-CM | POA: Diagnosis not present

## 2018-07-21 DIAGNOSIS — Z91040 Latex allergy status: Secondary | ICD-10-CM | POA: Diagnosis not present

## 2018-07-21 DIAGNOSIS — R1033 Periumbilical pain: Secondary | ICD-10-CM

## 2018-07-21 LAB — URINALYSIS, COMPLETE (UACMP) WITH MICROSCOPIC
Bacteria, UA: NONE SEEN
Bilirubin Urine: NEGATIVE
Bilirubin Urine: NEGATIVE
Glucose, UA: NEGATIVE mg/dL
Glucose, UA: NEGATIVE mg/dL
Hgb urine dipstick: NEGATIVE
Hgb urine dipstick: NEGATIVE
Ketones, ur: NEGATIVE mg/dL
Ketones, ur: NEGATIVE mg/dL
Leukocytes, UA: NEGATIVE
Leukocytes, UA: NEGATIVE
Nitrite: NEGATIVE
Nitrite: NEGATIVE
Protein, ur: NEGATIVE mg/dL
Protein, ur: NEGATIVE mg/dL
Specific Gravity, Urine: 1.01 (ref 1.005–1.030)
Specific Gravity, Urine: 1.015 (ref 1.005–1.030)
pH: 6.5 (ref 5.0–8.0)
pH: 6 (ref 5.0–8.0)

## 2018-07-21 LAB — CBC
HCT: 43.9 % (ref 35.0–47.0)
Hemoglobin: 15.5 g/dL (ref 12.0–16.0)
MCH: 31.7 pg (ref 26.0–34.0)
MCHC: 35.3 g/dL (ref 32.0–36.0)
MCV: 90 fL (ref 80.0–100.0)
Platelets: 222 10*3/uL (ref 150–440)
RBC: 4.88 MIL/uL (ref 3.80–5.20)
RDW: 13.1 % (ref 11.5–14.5)
WBC: 8.1 10*3/uL (ref 3.6–11.0)

## 2018-07-21 LAB — COMPREHENSIVE METABOLIC PANEL
ALT: 34 U/L (ref 0–44)
AST: 22 U/L (ref 15–41)
Albumin: 4.5 g/dL (ref 3.5–5.0)
Alkaline Phosphatase: 65 U/L (ref 38–126)
Anion gap: 6 (ref 5–15)
BUN: 13 mg/dL (ref 6–20)
CO2: 27 mmol/L (ref 22–32)
Calcium: 9.4 mg/dL (ref 8.9–10.3)
Chloride: 107 mmol/L (ref 98–111)
Creatinine, Ser: 0.67 mg/dL (ref 0.44–1.00)
GFR calc Af Amer: >60 mL/min (ref >60)
GFR calc non Af Amer: >60 mL/min (ref >60)
Glucose, Bld: 106 mg/dL — ABNORMAL HIGH (ref 70–99)
Potassium: 4.4 mmol/L (ref 3.5–5.1)
Sodium: 140 mmol/L (ref 135–145)
Total Bilirubin: 0.6 mg/dL (ref 0.3–1.2)
Total Protein: 7.6 g/dL (ref 6.5–8.1)

## 2018-07-21 LAB — LIPASE, BLOOD: Lipase: 40 U/L (ref 11–51)

## 2018-07-21 LAB — POCT PREGNANCY, URINE: Preg Test, Ur: NEGATIVE

## 2018-07-21 MED ORDER — CEPHALEXIN 500 MG PO CAPS: 500.0000 mg | ORAL_CAPSULE | Freq: Three times a day (TID) | ORAL | 0 refills | Status: DC

## 2018-07-21 MED ORDER — PROMETHAZINE HCL 25 MG PO TABS: 25.0000 mg | ORAL_TABLET | Freq: Four times a day (QID) | ORAL | 0 refills | Status: DC | PRN

## 2018-07-21 MED ORDER — PROMETHAZINE HCL 25 MG/ML IJ SOLN
12.5000 mg | Freq: Once | INTRAMUSCULAR | Status: AC
  Administered 2018-07-21: 12.5 mg via INTRAVENOUS
  Filled 2018-07-21: qty 1

## 2018-07-21 MED ORDER — IOPAMIDOL (ISOVUE-300) INJECTION 61%
100.0000 mL | Freq: Once | INTRAVENOUS | Status: AC | PRN
  Administered 2018-07-21: 100 mL via INTRAVENOUS

## 2018-07-21 MED ORDER — SULFAMETHOXAZOLE-TRIMETHOPRIM 800-160 MG PO TABS: 1.0000 | ORAL_TABLET | Freq: Two times a day (BID) | ORAL | 0 refills | Status: DC

## 2018-07-21 MED ORDER — MORPHINE SULFATE (PF) 4 MG/ML IV SOLN
4.0000 mg | Freq: Once | INTRAVENOUS | Status: AC
  Administered 2018-07-21: 4 mg via INTRAVENOUS
  Filled 2018-07-21: qty 1

## 2018-07-21 MED ORDER — SODIUM CHLORIDE 0.9 % IV BOLUS
1000.0000 mL | Freq: Once | INTRAVENOUS | Status: AC
  Administered 2018-07-21: 1000 mL via INTRAVENOUS

## 2018-07-21 NOTE — ED Triage Notes (Signed)
Patient c/o right side abdomen pain/ fever at home of 102.5 this morning/ diarrhea x yesterday / vomiting this morning. Patient also stated oozing/draingage at belly button.

## 2018-07-21 NOTE — ED Triage Notes (Signed)
Pt arrived via POV from [facility] with reports of RLQ pain x 2 days with fever, nausea and vomiting.  Pt has hx of 3 c-sections.

## 2018-07-21 NOTE — ED Provider Notes (Signed)
[facility] Emergency Department Provider Note  ____________________________________________  Time seen: Approximately 6:02 PM  I have reviewed the triage vital signs and the nursing notes.   HISTORY  Chief Complaint Abdominal Pain    HPI Heidi Hudson is a 32 y.o. female with a history of bipolar disorder and recent miscarriage 2 weeks ago who complains of gradual onset abdominal pain, worse in the right lower abdomen, constant, worse with movement, no alleviating factors.  Moderate to severe, aching.  Also reports having a fever up to 102 at home.  She went to urgent care today, they told her to come to the ED for evaluation due to possible appendicitis.  She took some Tylenol and then came to the ED. She also complains of pain in discharge from her umbilicus over the past 24 hours.  Past Medical History:  Diagnosis Date  . Bipolar disorder (HCC)   . Chronic headaches   . History of gestational diabetes   . Psychogenic nonepileptic seizure      There are no active problems to display for this patient.    Past Surgical History:  Procedure Laterality Date  . CESAREAN SECTION    . TUBAL LIGATION       Prior to Admission medications   Medication Sig Start Date End Date Taking? Authorizing Provider  cephALEXin (KEFLEX) 500 MG capsule Take 1 capsule (500 mg total) by mouth 3 (three) times daily. 07/21/18   Sharman CheekStafford, Berania Peedin, MD  omeprazole (PRILOSEC OTC) 20 MG tablet Take 1 tablet (20 mg total) by mouth daily. 02/28/17 02/28/18  Loleta RoseForbach, Cory, MD  promethazine (PHENERGAN) 25 MG tablet Take 1 tablet (25 mg total) by mouth every 6 (six) hours as needed for nausea or vomiting. 07/21/18   Sharman CheekStafford, Zannie Runkle, MD  sulfamethoxazole-trimethoprim (BACTRIM DS) 800-160 MG tablet Take 1 tablet by mouth 2 (two) times daily. 07/21/18   Sharman CheekStafford, Dustina Scoggin, MD     Allergies Latex and Hydrocodone   Family History  Problem Relation Age of Onset  . Hypertension Mother      Social History Social History   Tobacco Use  . Smoking status: Former Games developermoker  . Smokeless tobacco: Never Used  Substance Use Topics  . Alcohol use: No  . Drug use: No    Review of Systems  Constitutional:   Positive fever without chills .  ENT:   No sore throat. No rhinorrhea. Cardiovascular:   No chest pain or syncope. Respiratory:   No dyspnea or cough. Gastrointestinal:   Positive as above for abdominal pain.  No vomiting diarrhea or constipation.  Decreased appetite over the last 2 days  musculoskeletal:   Negative for focal pain or swelling All other systems reviewed and are negative except as documented above in ROS and HPI.  ____________________________________________   PHYSICAL EXAM:  VITAL SIGNS: ED Triage Vitals  Enc Vitals Group     BP 07/21/18 1209 108/72     Pulse Rate 07/21/18 1209 68     Resp 07/21/18 1209 16     Temp 07/21/18 1209 98.1 F (36.7 C)     Temp Source 07/21/18 1209 Oral     SpO2 07/21/18 1209 98 %     Weight --      Height --      Head Circumference --      Peak Flow --      Pain Score 07/21/18 1633 2     Pain Loc --      Pain Edu? --  Excl. in GC? --     Vital signs reviewed, nursing assessments reviewed.   Constitutional:   Alert and oriented. Non-toxic appearance. Eyes:   Conjunctivae are normal. EOMI. PERRL. ENT      Head:   Normocephalic and atraumatic.      Nose:   No congestion/rhinnorhea.       Mouth/Throat:   MMM, no pharyngeal erythema. No peritonsillar mass.       Neck:   No meningismus. Full ROM. Hematological/Lymphatic/Immunilogical:   No cervical lymphadenopathy. Cardiovascular:   RRR. Symmetric bilateral radial and DP pulses.  No murmurs. Cap refill less than 2 seconds. Respiratory:   Normal respiratory effort without tachypnea/retractions. Breath sounds are clear and equal bilaterally. No wheezes/rales/rhonchi. Gastrointestinal:   Soft with diffuse tenderness in the lower abdomen, worse in the right  lower quadrant.  Also tenderness, redness, scant drainage at the umbilicus with a small palpable mass just deep to the umbilicus.  Non distended. There is no CVA tenderness.  No rebound, rigidity, or guarding. Musculoskeletal:   Normal range of motion in all extremities. No joint effusions.  No lower extremity tenderness.  No edema. Neurologic:   Normal speech and language.  Motor grossly intact. No acute focal neurologic deficits are appreciated.  Skin:    Skin is warm, dry and intact. No rash noted.  No petechiae, purpura, or bullae.  ____________________________________________    LABS (pertinent positives/negatives) (all labs ordered are listed, but only abnormal results are displayed) Labs Reviewed  COMPREHENSIVE METABOLIC PANEL - Abnormal; Notable for the following components:      Result Value   Glucose, Bld 106 (*)    All other components within normal limits  URINALYSIS, COMPLETE (UACMP) WITH MICROSCOPIC - Abnormal; Notable for the following components:   Color, Urine YELLOW (*)    APPearance HAZY (*)    All other components within normal limits  LIPASE, BLOOD  CBC  POCT PREGNANCY, URINE  POC URINE PREG, ED   ____________________________________________   EKG    ____________________________________________    RADIOLOGY  Ct Abdomen Pelvis W Contrast  Result Date: 07/21/2018 CLINICAL DATA:  Right lower quadrant abdominal pain EXAM: CT ABDOMEN AND PELVIS WITH CONTRAST TECHNIQUE: Multidetector CT imaging of the abdomen and pelvis was performed using the standard protocol following bolus administration of intravenous contrast. CONTRAST:  ISOVUE-300 IOPAMIDOL (ISOVUE-300) INJECTION 61% COMPARISON:  Pelvic ultrasound 06/01/2018.  CT 02/11/2016 FINDINGS: Lower chest: Lung bases are clear. No effusions. Heart is normal size. Hepatobiliary: Area of focal fatty infiltration adjacent to the falciform ligament. Gallbladder unremarkable. Pancreas: No focal abnormality or  ductal dilatation. Spleen: No focal abnormality.  Normal size. Adrenals/Urinary Tract: No adrenal abnormality. No focal renal abnormality. No stones or hydronephrosis. Urinary bladder is unremarkable. Stomach/Bowel: Normal appendix Stomach, large and small bowel grossly unremarkable. Vascular/Lymphatic: No evidence of aneurysm or adenopathy. Reproductive: Uterus and adnexa unremarkable.  No mass. Other: No free fluid or free air. Musculoskeletal: No acute bony abnormality. IMPRESSION: No acute findings in the abdomen or pelvis. Normal appendix. Electronically Signed   By: Charlett Nose M.D.   On: 07/21/2018 16:22    ____________________________________________   PROCEDURES Procedures  ____________________________________________  DIFFERENTIAL DIAGNOSIS   Appendicitis, ovarian cyst, ruptured ovarian cyst, urachal cyst, possible abdominal wall abscess  CLINICAL IMPRESSION / ASSESSMENT AND PLAN / ED COURSE  Pertinent labs & imaging results that were available during my care of the patient were reviewed by me and considered in my medical decision making (see chart for details).  Patient presents with abdominal pain, reported fever.  Vital signs and labs are normal here.  Exam is worrisome for localized peritonitis, so CT scan ordered to evaluate.  IV Phenergan 12.5 mg, morphine 4 mg IV for symptom relief.  Clinical Course as of Jul 21 1801  Sun Jul 21, 2018  1729 CT abdomen read as normal.  No evidence of appendicitis or ovarian cyst.  With her umbilical findings, her sensation is consistent with an infected urachal cyst.  I will treat with Bactrim and Keflex and refer to general surgery for further evaluation.  Suitable for outpatient management at this time with normal vital signs and labs.   [PS]    Clinical Course User Index [PS] Sharman Cheek, MD     ____________________________________________   FINAL CLINICAL IMPRESSION(S) / ED DIAGNOSES    Final diagnoses:   Periumbilical abdominal pain  Infected urachal cyst     ED Discharge Orders        Ordered    cephALEXin (KEFLEX) 500 MG capsule  3 times daily     07/21/18 1802    sulfamethoxazole-trimethoprim (BACTRIM DS) 800-160 MG tablet  2 times daily     07/21/18 1802    promethazine (PHENERGAN) 25 MG tablet  Every 6 hours PRN     07/21/18 1802      Portions of this note were generated with dragon dictation software. Dictation errors may occur despite best attempts at proofreading.    Sharman Cheek, MD 07/21/18 [phone]

## 2018-07-21 NOTE — ED Notes (Signed)
Pt to radiology.

## 2018-07-21 NOTE — Discharge Instructions (Addendum)
Go directly to emergency room as discussed.  °

## 2018-07-21 NOTE — Discharge Instructions (Signed)
Your CT scan and lab tests today were all normal.  You do have a developing infection at the belly button which appears to be causing your pain and fever. Continue taking anti-inflammatory medicine (ibuprofen 600mg  every 6 hours, tylenol 650mg  every 6 hours) for pain and fever control, and take the prescribed antibiotics to treat the infection.

## 2018-07-21 NOTE — ED Provider Notes (Signed)
[facility] ____________________________________________  Time seen: Approximately 12:19 PM  I have reviewed the triage vital signs and the nursing notes.   HISTORY  Chief Complaint stomach issue   HPI Heidi Hudson is a 32 y.o. female presenting for evaluation of right lower abdominal pain that started yesterday afternoon and quickly increased last night into today.  Patient reports she had an accompanying fever last night of 102.5.  States that she did take Tylenol last night as well as she did also take Tylenol this morning.  Denies of vomiting, diarrhea, dysuria.  Denies any trauma.  Denies history of the same.  Did report she had a miscarriage 1 month ago, but reports she had follow-up in she was told by her OB/GYN that she fully passed everything.  3 previous C-sections, no other abdominal surgeries.  Denies cough, congestion, sore throat or other sickness.  Also states that she had some drainage from her bellybutton, but states that that she suspects that it was a scratch area.  States having pain to even sit up and move.  Denies other aggravating or alleviating factors.  Patient's last menstrual period was 07/07/2018.Denies pregnancy.  Past Medical History:  Diagnosis Date  . Bipolar disorder (HCC)   . Chronic headaches   . History of gestational diabetes   . Psychogenic nonepileptic seizure     There are no active problems to display for this patient.   Past Surgical History:  Procedure Laterality Date  . CESAREAN SECTION    . TUBAL LIGATION       No current facility-administered medications for this encounter.   Current Outpatient Medications:  .  benzonatate (TESSALON) 200 MG capsule, Take 1 capsule (200 mg total) by mouth 3 (three) times daily as needed for cough. (Patient not taking: Reported on 04/05/2016), Disp: 20 capsule, Rfl: 0 .  ciprofloxacin (CIPRO) 500 MG tablet, Take 1 tablet (500 mg total) by mouth 2 (two) times daily. (Patient not  taking: Reported on 04/05/2016), Disp: 10 tablet, Rfl: 0 .  omeprazole (PRILOSEC OTC) 20 MG tablet, Take 1 tablet (20 mg total) by mouth daily., Disp: 28 tablet, Rfl: 1  Allergies Latex and Hydrocodone  Family History  Problem Relation Age of Onset  . Hypertension Mother     Social History Social History   Tobacco Use  . Smoking status: Former Games developermoker  . Smokeless tobacco: Never Used  Substance Use Topics  . Alcohol use: No  . Drug use: No    Review of Systems Constitutional: positive fever at home Cardiovascular: Denies chest pain. Respiratory: Denies shortness of breath. Gastrointestinal: as above.  Genitourinary: Negative for dysuria. Musculoskeletal: Negative for back pain. Skin: Negative for rash. ____________________________________________   PHYSICAL EXAM:  VITAL SIGNS: ED Triage Vitals  Enc Vitals Group     BP 07/21/18 1050 111/77     Pulse Rate 07/21/18 1050 86     Resp 07/21/18 1050 16     Temp 07/21/18 1050 98.4 F (36.9 C)     Temp Source 07/21/18 1050 Oral     SpO2 07/21/18 1050 99 %     Weight 07/21/18 1051 160 lb (72.6 kg)     Height 07/21/18 1051 5\' 4"  (1.626 m)     Head Circumference --      Peak Flow --      Pain Score 07/21/18 1051 10     Pain Loc --      Pain Edu? --      Excl. in GC? --  Exam limited as patient stated to painful to get on exam table, and remained in wheelchair.  Constitutional: Alert and oriented.  ENT      Head: Normocephalic and atraumatic. Cardiovascular: Normal rate, regular rhythm. Grossly normal heart sounds.  Good peripheral circulation. Respiratory: Normal respiratory effort without tachypnea nor retractions. Breath sounds are clear and equal bilaterally. No wheezes, rales, rhonchi. Gastrointestinal: Normal Bowel sounds.  Patient guarding right abdomen.  Moderate to severe lower quadrant abdominal tenderness at McBurney's point as well as lower, minimal tenderness around the umbilicus, otherwise abdomen soft  and nontender.  No CVA tenderness. Neurologic:  Normal speech and language. No gross focal neurologic deficits are appreciated. Speech is normal. No gait instability.  Skin:  Skin is warm, dry except: Longer under the umbilicus superficial abrasion present with minimal erythema, no purulent drainage expressed, no induration. Psychiatric: Mood and affect are normal. Speech and behavior are normal. Patient exhibits appropriate insight and judgment   ___________________________________________   LABS (all labs ordered are listed, but only abnormal results are displayed)  Labs Reviewed  URINALYSIS, COMPLETE (UACMP) WITH MICROSCOPIC - Abnormal; Notable for the following components:      Result Value   Color, Urine STRAW (*)    APPearance HAZY (*)    Bacteria, UA RARE (*)    All other components within normal limits     PROCEDURES Procedures     INITIAL IMPRESSION / ASSESSMENT AND PLAN / ED COURSE  Pertinent labs & imaging results that were available during my care of the patient were reviewed by me and considered in my medical decision making (see chart for details).  Patient presenting for right lower abdominal pain with accompanying fever onset last night.  Patient guarding abdomen and stated to painful to get on exam table.  Continues with right lower abdominal tenderness and tenderness at McBurney's point and over her right ovary.  Discussed with patient due to severity of pain recommend further evaluation in emergency room at this time with likely need of imaging.  Patient verbalized understanding agreed to plan.  States that her private boyfriend will take her.  Encouraged to remain n.p.o.  States that she will go to [facility].  Sport and exercise psychologist triage nurse called and given report.   ____________________________________________   FINAL CLINICAL IMPRESSION(S) / ED DIAGNOSES  Final diagnoses:  Right lower quadrant abdominal pain     ED Discharge Orders    None        Note: This dictation was prepared with Dragon dictation along with smaller phrase technology. Any transcriptional errors that result from this process are unintentional.         Renford Dills, NP 07/21/18 1245

## 2018-07-21 NOTE — ED Triage Notes (Signed)
First Nurse Note:  [facility] for c/o 2 day history of RLQ abdominal pain and fever.

## 2018-07-29 ENCOUNTER — Observation Stay
Admission: RE | Admit: 2018-07-29 | Discharge: 2018-07-30 | Disposition: A | Discharge status: Home / Self Care | Payer: Medicaid Other | Source: Ambulatory Visit | Attending: Surgery | Admitting: Surgery

## 2018-07-29 ENCOUNTER — Other Ambulatory Visit: Payer: Self-pay

## 2018-07-29 ENCOUNTER — Ambulatory Visit: Payer: Medicaid Other | Admitting: Registered Nurse

## 2018-07-29 ENCOUNTER — Ambulatory Visit: Payer: Self-pay | Admitting: Surgery

## 2018-07-29 ENCOUNTER — Encounter: Payer: Self-pay | Admitting: *Deleted

## 2018-07-29 ENCOUNTER — Encounter
Admission: RE | Disposition: A | Discharge status: Home / Self Care | Payer: Self-pay | Source: Ambulatory Visit | Attending: Surgery

## 2018-07-29 DIAGNOSIS — R109 Unspecified abdominal pain: Secondary | ICD-10-CM

## 2018-07-29 DIAGNOSIS — Z87891 Personal history of nicotine dependence: Secondary | ICD-10-CM | POA: Insufficient documentation

## 2018-07-29 DIAGNOSIS — Z791 Long term (current) use of non-steroidal anti-inflammatories (NSAID): Secondary | ICD-10-CM | POA: Diagnosis not present

## 2018-07-29 DIAGNOSIS — K420 Umbilical hernia with obstruction, without gangrene: Secondary | ICD-10-CM | POA: Diagnosis present

## 2018-07-29 DIAGNOSIS — R1031 Right lower quadrant pain: Secondary | ICD-10-CM

## 2018-07-29 DIAGNOSIS — N838 Other noninflammatory disorders of ovary, fallopian tube and broad ligament: Secondary | ICD-10-CM

## 2018-07-29 DIAGNOSIS — Z79899 Other long term (current) drug therapy: Secondary | ICD-10-CM | POA: Insufficient documentation

## 2018-07-29 DIAGNOSIS — R935 Abnormal findings on diagnostic imaging of other abdominal regions, including retroperitoneum: Secondary | ICD-10-CM

## 2018-07-29 HISTORY — PX: UMBILICAL HERNIA REPAIR: SHX196

## 2018-07-29 LAB — CBC WITH DIFFERENTIAL/PLATELET
Basophils Absolute: 0 10*3/uL (ref 0–0.1)
Basophils Relative: 0 %
Eosinophils Absolute: 0.1 10*3/uL (ref 0–0.7)
Eosinophils Relative: 1 %
HCT: 39.7 % (ref 35.0–47.0)
Hemoglobin: 14 g/dL (ref 12.0–16.0)
Lymphocytes Relative: 30 %
Lymphs Abs: 2.5 10*3/uL (ref 1.0–3.6)
MCH: 31.8 pg (ref 26.0–34.0)
MCHC: 35.2 g/dL (ref 32.0–36.0)
MCV: 90.5 fL (ref 80.0–100.0)
Monocytes Absolute: 0.6 10*3/uL (ref 0.2–0.9)
Monocytes Relative: 7 %
Neutro Abs: 5.3 10*3/uL (ref 1.4–6.5)
Neutrophils Relative %: 62 %
Platelets: 232 10*3/uL (ref 150–440)
RBC: 4.38 MIL/uL (ref 3.80–5.20)
RDW: 12.8 % (ref 11.5–14.5)
WBC: 8.5 10*3/uL (ref 3.6–11.0)

## 2018-07-29 LAB — POCT PREGNANCY, URINE: Preg Test, Ur: NEGATIVE

## 2018-07-29 SURGERY — REPAIR, HERNIA, UMBILICAL, ADULT
Anesthesia: General

## 2018-07-29 MED ORDER — ACETAMINOPHEN 500 MG PO TABS
1000.0000 mg | ORAL_TABLET | Freq: Four times a day (QID) | ORAL | Status: DC
  Administered 2018-07-30 (×3): 1000 mg via ORAL
  Filled 2018-07-29 (×3): qty 2

## 2018-07-29 MED ORDER — ONDANSETRON HCL 4 MG/2ML IJ SOLN
INTRAMUSCULAR | Status: DC | PRN
  Administered 2018-07-29: 4 mg via INTRAVENOUS

## 2018-07-29 MED ORDER — HYDROMORPHONE HCL 1 MG/ML IJ SOLN
INTRAMUSCULAR | Status: AC
  Filled 2018-07-29: qty 1

## 2018-07-29 MED ORDER — BUPIVACAINE LIPOSOME 1.3 % IJ SUSP
INTRAMUSCULAR | Status: AC
  Filled 2018-07-29: qty 20

## 2018-07-29 MED ORDER — ACETAMINOPHEN 10 MG/ML IV SOLN
INTRAVENOUS | Status: AC
  Filled 2018-07-29: qty 100

## 2018-07-29 MED ORDER — FENTANYL CITRATE (PF) 100 MCG/2ML IJ SOLN
INTRAMUSCULAR | Status: AC
  Filled 2018-07-29: qty 2

## 2018-07-29 MED ORDER — KETOROLAC TROMETHAMINE 30 MG/ML IJ SOLN
INTRAMUSCULAR | Status: DC | PRN
  Administered 2018-07-29: 30 mg via INTRAVENOUS

## 2018-07-29 MED ORDER — SUCCINYLCHOLINE CHLORIDE 20 MG/ML IJ SOLN
INTRAMUSCULAR | Status: DC | PRN
  Administered 2018-07-29: 100 mg via INTRAVENOUS

## 2018-07-29 MED ORDER — ONDANSETRON HCL 4 MG/2ML IJ SOLN
INTRAMUSCULAR | Status: AC
  Filled 2018-07-29: qty 2

## 2018-07-29 MED ORDER — CEFAZOLIN SODIUM-DEXTROSE 2-4 GM/100ML-% IV SOLN
INTRAVENOUS | Status: AC
  Filled 2018-07-29: qty 100

## 2018-07-29 MED ORDER — SUGAMMADEX SODIUM 200 MG/2ML IV SOLN
INTRAVENOUS | Status: AC
  Filled 2018-07-29: qty 2

## 2018-07-29 MED ORDER — ROCURONIUM BROMIDE 100 MG/10ML IV SOLN
INTRAVENOUS | Status: DC | PRN
  Administered 2018-07-29: 5 mg via INTRAVENOUS
  Administered 2018-07-29: 20 mg via INTRAVENOUS

## 2018-07-29 MED ORDER — HYDROMORPHONE HCL 1 MG/ML IJ SOLN
0.5000 mg | INTRAMUSCULAR | Status: DC | PRN
  Administered 2018-07-29 – 2018-07-30 (×2): 0.5 mg via INTRAVENOUS

## 2018-07-29 MED ORDER — ENOXAPARIN SODIUM 40 MG/0.4ML ~~LOC~~ SOLN
40.0000 mg | SUBCUTANEOUS | Status: DC
  Administered 2018-07-30: 40 mg via SUBCUTANEOUS
  Filled 2018-07-29: qty 0.4

## 2018-07-29 MED ORDER — LIDOCAINE HCL (CARDIAC) PF 100 MG/5ML IV SOSY
PREFILLED_SYRINGE | INTRAVENOUS | Status: DC | PRN
  Administered 2018-07-29: 80 mg via INTRAVENOUS

## 2018-07-29 MED ORDER — BUPIVACAINE-EPINEPHRINE (PF) 0.5% -1:200000 IJ SOLN
INTRAMUSCULAR | Status: DC | PRN
  Administered 2018-07-29: 1.5 mL via PERINEURAL

## 2018-07-29 MED ORDER — LACTATED RINGERS IV SOLN
INTRAVENOUS | Status: DC | PRN
  Administered 2018-07-29: 18:00:00 via INTRAVENOUS

## 2018-07-29 MED ORDER — ONDANSETRON HCL 4 MG/2ML IJ SOLN
4.0000 mg | Freq: Four times a day (QID) | INTRAMUSCULAR | Status: DC | PRN
Start: 2018-07-29 — End: 2018-07-30

## 2018-07-29 MED ORDER — FENTANYL CITRATE (PF) 100 MCG/2ML IJ SOLN
INTRAMUSCULAR | Status: DC | PRN
  Administered 2018-07-29 (×2): 50 ug via INTRAVENOUS

## 2018-07-29 MED ORDER — CHLORHEXIDINE GLUCONATE 4 % EX LIQD: 1.0000 "application " | Freq: Once | CUTANEOUS | Status: DC

## 2018-07-29 MED ORDER — BUPIVACAINE-EPINEPHRINE (PF) 0.5% -1:200000 IJ SOLN
INTRAMUSCULAR | Status: AC
  Filled 2018-07-29: qty 30

## 2018-07-29 MED ORDER — PROMETHAZINE HCL 25 MG/ML IJ SOLN
INTRAMUSCULAR | Status: AC
  Administered 2018-07-29: 22:00:00
  Filled 2018-07-29: qty 1

## 2018-07-29 MED ORDER — SODIUM CHLORIDE FLUSH 0.9 % IV SOLN
INTRAVENOUS | Status: AC
  Administered 2018-07-29: 22:00:00
  Filled 2018-07-29: qty 10

## 2018-07-29 MED ORDER — LACTATED RINGERS IV SOLN: INTRAVENOUS | Status: DC

## 2018-07-29 MED ORDER — DEXAMETHASONE SODIUM PHOSPHATE 10 MG/ML IJ SOLN
INTRAMUSCULAR | Status: AC
  Filled 2018-07-29: qty 1

## 2018-07-29 MED ORDER — DOCUSATE SODIUM 100 MG PO CAPS: 100.0000 mg | ORAL_CAPSULE | Freq: Two times a day (BID) | ORAL | Status: DC | PRN

## 2018-07-29 MED ORDER — PROPOFOL 10 MG/ML IV BOLUS
INTRAVENOUS | Status: DC | PRN
  Administered 2018-07-29: 180 mg via INTRAVENOUS

## 2018-07-29 MED ORDER — FENTANYL CITRATE (PF) 100 MCG/2ML IJ SOLN
25.0000 ug | INTRAMUSCULAR | Status: DC | PRN
  Administered 2018-07-29 (×4): 25 ug via INTRAVENOUS

## 2018-07-29 MED ORDER — ONDANSETRON 4 MG PO TBDP: 4.0000 mg | ORAL_TABLET | Freq: Four times a day (QID) | ORAL | Status: DC | PRN

## 2018-07-29 MED ORDER — SEVOFLURANE IN SOLN
RESPIRATORY_TRACT | Status: AC
  Filled 2018-07-29: qty 250

## 2018-07-29 MED ORDER — MEPERIDINE HCL 50 MG/ML IJ SOLN: 6.2500 mg | INTRAMUSCULAR | Status: DC | PRN

## 2018-07-29 MED ORDER — MIDAZOLAM HCL 2 MG/2ML IJ SOLN
INTRAMUSCULAR | Status: AC
  Filled 2018-07-29: qty 2

## 2018-07-29 MED ORDER — MIDAZOLAM HCL 2 MG/2ML IJ SOLN
INTRAMUSCULAR | Status: DC | PRN
  Administered 2018-07-29: 2 mg via INTRAVENOUS

## 2018-07-29 MED ORDER — ACETAMINOPHEN 10 MG/ML IV SOLN
INTRAVENOUS | Status: DC | PRN
  Administered 2018-07-29: 1000 mg via INTRAVENOUS

## 2018-07-29 MED ORDER — KETOROLAC TROMETHAMINE 30 MG/ML IJ SOLN
INTRAMUSCULAR | Status: AC
  Filled 2018-07-29: qty 1

## 2018-07-29 MED ORDER — PROMETHAZINE HCL 25 MG/ML IJ SOLN
6.2500 mg | INTRAMUSCULAR | Status: DC | PRN
  Administered 2018-07-29: 12.5 mg via INTRAVENOUS

## 2018-07-29 MED ORDER — CEFAZOLIN SODIUM-DEXTROSE 2-4 GM/100ML-% IV SOLN
2.0000 g | INTRAVENOUS | Status: AC
  Administered 2018-07-29: 2 g via INTRAVENOUS

## 2018-07-29 MED ORDER — BUPIVACAINE LIPOSOME 1.3 % IJ SUSP
INTRAMUSCULAR | Status: DC | PRN
  Administered 2018-07-29: 20 mL

## 2018-07-29 MED ORDER — FENTANYL CITRATE (PF) 100 MCG/2ML IJ SOLN
INTRAMUSCULAR | Status: AC
  Administered 2018-07-29: 25 ug via INTRAVENOUS
  Filled 2018-07-29: qty 2

## 2018-07-29 MED ORDER — DEXAMETHASONE SODIUM PHOSPHATE 10 MG/ML IJ SOLN
INTRAMUSCULAR | Status: DC | PRN
  Administered 2018-07-29: 5 mg via INTRAVENOUS

## 2018-07-29 MED ORDER — SUGAMMADEX SODIUM 200 MG/2ML IV SOLN
INTRAVENOUS | Status: DC | PRN
  Administered 2018-07-29: 200 mg via INTRAVENOUS

## 2018-07-29 SURGICAL SUPPLY — 31 items
ADH SKN CLS APL DERMABOND .7 (GAUZE/BANDAGES/DRESSINGS) ×1
BLADE SURG 15 STRL LF DISP TIS (BLADE) ×1 IMPLANT
BLADE SURG 15 STRL SS (BLADE) ×2
CANISTER SUCT 1200ML W/VALVE (MISCELLANEOUS) ×2 IMPLANT
CHLORAPREP W/TINT 26ML (MISCELLANEOUS) ×2 IMPLANT
DERMABOND ADVANCED (GAUZE/BANDAGES/DRESSINGS) ×1
DERMABOND ADVANCED .7 DNX12 (GAUZE/BANDAGES/DRESSINGS) ×1 IMPLANT
DRAPE LAPAROTOMY 77X122 PED (DRAPES) ×2 IMPLANT
ELECT REM PT RETURN 9FT ADLT (ELECTROSURGICAL) ×2
ELECTRODE REM PT RTRN 9FT ADLT (ELECTROSURGICAL) ×1 IMPLANT
GLOVE BIO SURGEON STRL SZ 6.5 (GLOVE) ×2 IMPLANT
GLOVE BIOGEL PI IND STRL 7.0 (GLOVE) ×1 IMPLANT
GLOVE BIOGEL PI INDICATOR 7.0 (GLOVE) ×1
GOWN STRL REUS W/ TWL LRG LVL3 (GOWN DISPOSABLE) ×3 IMPLANT
GOWN STRL REUS W/TWL LRG LVL3 (GOWN DISPOSABLE) ×6
KIT TURNOVER KIT A (KITS) ×2 IMPLANT
LABEL OR SOLS (LABEL) ×2 IMPLANT
NEEDLE HYPO 22GX1.5 SAFETY (NEEDLE) ×2 IMPLANT
NS IRRIG 500ML POUR BTL (IV SOLUTION) ×2 IMPLANT
PACK BASIN MINOR ARMC (MISCELLANEOUS) ×2 IMPLANT
SUT ETHIBOND NAB MO 7 #0 18IN (SUTURE) ×2 IMPLANT
SUT MNCRL 4-0 (SUTURE) ×2
SUT MNCRL 4-0 27XMFL (SUTURE) ×1
SUT VIC AB 2-0 SH 27 (SUTURE) ×2
SUT VIC AB 2-0 SH 27XBRD (SUTURE) ×1 IMPLANT
SUT VIC AB 3-0 SH 27 (SUTURE) ×2
SUT VIC AB 3-0 SH 27X BRD (SUTURE) ×1 IMPLANT
SUTURE MNCRL 4-0 27XMF (SUTURE) ×1 IMPLANT
SYR 10ML LL (SYRINGE) ×4 IMPLANT
TOWEL OR 17X26 4PK STRL BLUE (TOWEL DISPOSABLE) ×2 IMPLANT
WATER STERILE IRR 1000ML POUR (IV SOLUTION) ×2 IMPLANT

## 2018-07-29 NOTE — H&P (Signed)
Subjective:   CC: umbilical hernia  HPI:Heidi Hudsonis a 32 y.o.femalewho was [facility]. Symptoms were first noted2weekago. Pain is sharp and constant,confined to theumbilical area, without radiation. Associated with some diarrhea, nausea, vomiting, exacerbated bymovementLump not apparent or palpable by patientreducible. Patient never endorses a lump in the area or having a hx of umbilical hernia.She states her umbilicus is covered in green, smelly discharge each morning. ED workup did not have a wbc, and no obvious cyst noted on CT. She was sent home for outpt f/u.  Returns today in clinic due to increasing pain and discharge, despite finishing abx.  Hardly able to walk due to pain.  Past Medical History:  has no past medical history on file.  Past Surgical History:       Past Surgical History:  Procedure Laterality Date  . CESAREAN SECTION     x3 -- 2009, 2012, 2016    Family History: family history includes Anemia in her mother; Anxiety in her mother; Depression in her mother; Diabetes in her maternal grandmother; High blood pressure (Hypertension) in her mother.  Social History:  reports that she has been smoking cigarettes.  She has been smoking about 1.00 pack per day. She has never used smokeless tobacco. She reports that she drinks alcohol. She reports that she has current or past drug history.  Current Medications: has a current medication list which includes the following prescription(s): cephalexin, omeprazole, promethazine, sulfamethoxazole-trimethoprim, acetaminophen-codeine, and ibuprofen.  Allergies:       Allergies as of 07/29/2018 - Reviewed 07/29/2018  Allergen Reaction Noted  . Hydrocodone Hives 07/24/2018  . Latex Rash 07/24/2018    ROS:  A 15 point review of systems was performed and pertinent positives and negatives noted in HPI   Objective:     BP 125/82   Pulse 76   Temp 36.8 C  (98.2 F) (Oral)   Ht 162.6 cm (5' 4")   Wt 73.5 kg (162 lb)   BMI 27.81 kg/m   Constitutional :  alert, appears stated age, cooperative and mild distress  Lymphatics/Throat:  no asymmetry, masses, or scars  Respiratory:  clear to auscultation bilaterally  Cardiovascular:  regular rate and rhythm, S1, S2 normal, no murmur, click, rub or gallop  Gastrointestinal: soft, but voluntary guarding with palpation in RLQ and periumbilical region.  no palpable lump, hernia, induration, fluctuance, but there is overlying erythema noted at the base of the umbilicus.  no acitve discharge. pain is worst at this point..    Musculoskeletal: Steady gait and movement  Skin: Cool and moist,  Psychiatric: Normal affect, non-agitated, not confused       LABS:    RADS: Bedside US performed by me of abdominal wall did not show any fluid collection or obvious hernia, granted this was a very limited exam by me Assessment:       Umbilical hernia with obstruction, without gangrene [K42.0]  Plan:     1. Umbilical hernia with obstruction, without gangrene [K42.0]   Based on new worsening presentation.  Only intervention at this point is surgical exploration.  Incarcerated umbilical hernia is most likely diagnosis, except for the remote possibility of a urachal cyst given the peristent drainage.  Either way, due to the acute increase in pain not controlled with meds, I recommended surgical exploration.   Discussed the risk of hernia surgery including recurrence, which can be up to 50% in the case of incisional or complex hernias, possible use of prosthetic   materials (mesh) and the increased risk of mesh infxn if used, bleeding, chronic pain, post-op infxn, post-op SBO or ileus, and possible re-operation to address said risks. The risks of general anesthetic, if used, includes MI, CVA, sudden death or even reaction to anesthetic medications also discussed. Alternatives include continued observation.   Benefits include possible symptom relief, prevention of incarceration, strangulation, enlargement in size over time, and the risk of emergency surgery in the face of strangulation.   Typical post-op recovery time of 3-5 days with 4-6 weeks of activity restrictions were also discussed.   The patient verbalized understanding and all questions were answered to the patient's satisfaction.   2. Patient has elected to proceed with surgical treatment. Procedure scheduled for later today.     

## 2018-07-29 NOTE — Anesthesia Preprocedure Evaluation (Signed)
Anesthesia Evaluation  Patient identified by MRN, date of birth, ID band Patient awake    Reviewed: Allergy & Precautions, NPO status , Patient's Chart, lab work & pertinent test results  History of Anesthesia Complications Negative for: history of anesthetic complications  Airway Mallampati: II  TM Distance: >3 FB Neck ROM: Full    Dental  (+) Upper Dentures, Poor Dentition, Missing   Pulmonary neg sleep apnea, neg COPD, former smoker,    breath sounds clear to auscultation- rhonchi (-) wheezing      Cardiovascular Exercise Tolerance: Good (-) hypertension(-) CAD and (-) Past MI  Rhythm:Regular Rate:Normal - Systolic murmurs and - Diastolic murmurs    Neuro/Psych  Headaches, PSYCHIATRIC DISORDERS Bipolar Disorder    GI/Hepatic negative GI ROS, Neg liver ROS,   Endo/Other  negative endocrine ROSneg diabetes  Renal/GU negative Renal ROS     Musculoskeletal negative musculoskeletal ROS (+)   Abdominal (+) - obese,   Peds  Hematology negative hematology ROS (+)   Anesthesia Other Findings Past Medical History: No date: Bipolar disorder (HCC) No date: Chronic headaches No date: History of gestational diabetes No date: Psychogenic nonepileptic seizure   Reproductive/Obstetrics                             Anesthesia Physical Anesthesia Plan  ASA: II and emergent  Anesthesia Plan: General   Post-op Pain Management:    Induction: Intravenous, Cricoid pressure planned and Rapid sequence  PONV Risk Score and Plan: 2 and Ondansetron, Dexamethasone and Midazolam  Airway Management Planned: Oral ETT  Additional Equipment:   Intra-op Plan:   Post-operative Plan: Extubation in OR  Informed Consent: I have reviewed the patients History and Physical, chart, labs and discussed the procedure including the risks, benefits and alternatives for the proposed anesthesia with the patient or  authorized representative who has indicated his/her understanding and acceptance.   Dental advisory given  Plan Discussed with: CRNA and Anesthesiologist  Anesthesia Plan Comments:         Anesthesia Quick Evaluation

## 2018-07-29 NOTE — Anesthesia Post-op Follow-up Note (Signed)
Anesthesia QCDR form completed.        

## 2018-07-29 NOTE — Op Note (Addendum)
Preoperative diagnosis: incarcerated umbilical hernia  Postoperative diagnosis: same  Procedure: open primary umbilical hernia repair  Anesthesia: GETA   Surgeon: Sung AmabileIsami Arpi Diebold  Specimen: none  Complications: None  EBL: minimal  Wound Classification: Clean   Indications: see HPI  Findings: 7mm umbilical hernia, viable pre-peritoneal fat. No other pathology within the area.  Description of procedure: The patient was placed on the operating table in the supine position. SCDs placed, pre-op abx administered.  General anesthesia was induced A time-out was completed verifying correct patient, procedure, site, positioning, and implant(s) and/or special equipment prior to beginning this procedure. The abdomen was prepped and draped in the usual sterile fashion.  An incision was made in a natural skin line under the umbilicus.  Dissection carried down to fascia where the umbilical stalk was transected.  Upon inspection there was a 7 mm umbilical hernia with viable preperitoneal fat within the umbilical stalk.  The base of the umbilicus on the skin was clean with no fistula.  The sac was dissected off the dermis as well as the fascia.  The preperitoneal fat contents were then reduced back into the preperitoneal cavity.  Area around the umbilicus was dissected clean to roughly 4 cm circumferentially and there was still noted to be no other pathology such as a occult abscess, mass or cyst like structure.  No purulent material was noted within the operative field the entire procedure as well.  At this point decision was made to repair the umbilical hernia with 0 Ethibond x2 in a figure-of-eight fashion.  Operative field was then inspected one last time to confirm no other pathology was noted hemostasis was confirmed.  20 mL's of Exparel was injected within the fascia and subcutaneous layer prior to attaching the umbilical stalk with 3-0 vicryl, then closing the incision with 3-0 Vicryl in the subdermal  layer in an interrupted fashion and then 4-0 Monocryl in a running subcuticular fashion.  The wound was then dressed with Dermabond.  The patient tolerated the procedure well and was taken to the postanesthesia care unit in stable condition after successful extubation.  All sponge and instrument count correct at end of procedure.

## 2018-07-29 NOTE — Anesthesia Procedure Notes (Signed)
Procedure Name: Intubation Date/Time: 07/29/2018 6:22 PM Performed by: Hedda Slade, CRNA Pre-anesthesia Checklist: Patient identified, Patient being monitored, Timeout performed, Emergency Drugs available and Suction available Patient Re-evaluated:Patient Re-evaluated prior to induction Oxygen Delivery Method: Circle system utilized Preoxygenation: Pre-oxygenation with 100% oxygen Induction Type: IV induction and Rapid sequence Laryngoscope Size: Mac and 3 Grade View: Grade I Tube type: Oral Tube size: 7.0 mm Number of attempts: 1 Airway Equipment and Method: Stylet Placement Confirmation: ETT inserted through vocal cords under direct vision,  positive ETCO2 and breath sounds checked- equal and bilateral Secured at: 21 cm Tube secured with: Tape Dental Injury: Teeth and Oropharynx as per pre-operative assessment

## 2018-07-29 NOTE — H&P (Signed)
Subjective:   CC: umbilical hernia  ZOX:WRUEAVWU Stogsdill Davisis a 31 y.o.femalewho was [facility]for [facility] above. Symptoms were first noted2weekago. Pain is sharp and constant,confined to theumbilical area, without radiation. Associated with some diarrhea, nausea, vomiting, exacerbated bymovementLump not apparent or palpable by patientreducible. Patient never endorses a lump in the area or having a hx of umbilical hernia.She states her umbilicus is covered in green, smelly discharge each morning. ED workup did not have a wbc, and no obvious cyst noted on CT. She was sent home for outpt f/u.  Returns today in clinic due to increasing pain and discharge, despite finishing abx.  Hardly able to walk due to pain.  Past Medical History:  has no past medical history on file.  Past Surgical History:       Past Surgical History:  Procedure Laterality Date  . CESAREAN SECTION     x3 -- 2009, 2012, 2016    Family History: family history includes Anemia in her mother; Anxiety in her mother; Depression in her mother; Diabetes in her maternal grandmother; High blood pressure (Hypertension) in her mother.  Social History:  reports that she has been smoking cigarettes.  She has been smoking about 1.00 pack per day. She has never used smokeless tobacco. She reports that she drinks alcohol. She reports that she has current or past drug history.  Current Medications: has a current medication list which includes the following prescription(s): cephalexin, omeprazole, promethazine, sulfamethoxazole-trimethoprim, acetaminophen-codeine, and ibuprofen.  Allergies:       Allergies as of 07/29/2018 - Reviewed 07/29/2018  Allergen Reaction Noted  . Hydrocodone Hives 07/24/2018  . Latex Rash 07/24/2018    ROS:  A 15 point review of systems was performed and pertinent positives and negatives noted in HPI   Objective:     BP 125/82   Pulse 76   Temp 36.8 C  (98.2 F) (Oral)   Ht 162.6 cm (5\' 4" )   Wt 73.5 kg (162 lb)   BMI 27.81 kg/m   Constitutional :  alert, appears stated age, cooperative and mild distress  Lymphatics/Throat:  no asymmetry, masses, or scars  Respiratory:  clear to auscultation bilaterally  Cardiovascular:  regular rate and rhythm, S1, S2 normal, no murmur, click, rub or gallop  Gastrointestinal: soft, but voluntary guarding with palpation in RLQ and periumbilical region.  no palpable lump, hernia, induration, fluctuance, but there is overlying erythema noted at the base of the umbilicus.  no acitve discharge. pain is worst at this point..    Musculoskeletal: Steady gait and movement  Skin: Cool and moist,  Psychiatric: Normal affect, non-agitated, not confused       LABS:    RADS: Bedside US performed by me of abdominal wall did not show any fluid collection or obvious hernia, granted this was a very limited exam by me Assessment:       Umbilical hernia with obstruction, without gangrene [K42.0]  Plan:     1. Umbilical hernia with obstruction, without gangrene [K42.0]   Based on new worsening presentation.  Only intervention at this point is surgical exploration.  Incarcerated umbilical hernia is most likely diagnosis, except for the remote possibility of a urachal cyst given the peristent drainage.  Either way, due to the acute increase in pain not controlled with meds, I recommended surgical exploration.   Discussed the risk of hernia surgery including recurrence, which can be up to 50% in the case of incisional or complex hernias, possible use of prosthetic  materials (mesh) and the increased risk of mesh infxn if used, bleeding, chronic pain, post-op infxn, post-op SBO or ileus, and possible re-operation to address said risks. The risks of general anesthetic, if used, includes MI, CVA, sudden death or even reaction to anesthetic medications also discussed. Alternatives include continued observation.   Benefits include possible symptom relief, prevention of incarceration, strangulation, enlargement in size over time, and the risk of emergency surgery in the face of strangulation.   Typical post-op recovery time of 3-5 days with 4-6 weeks of activity restrictions were also discussed.   The patient verbalized understanding and all questions were answered to the patient's satisfaction.   2. Patient has elected to proceed with surgical treatment. Procedure scheduled for later today.

## 2018-07-29 NOTE — Transfer of Care (Signed)
Immediate Anesthesia Transfer of Care Note  Patient: Heidi Hudson  Procedure(s) Performed: HERNIA REPAIR UMBILICAL ADULT (N/A )  Patient Location: PACU  Anesthesia Type:General  Level of Consciousness: sedated  Airway & Oxygen Therapy: Patient Spontanous Breathing and Patient connected to face mask oxygen  Post-op Assessment: Report given to RN and Post -op Vital signs reviewed and stable  Post vital signs: Reviewed and stable  Last Vitals:  Vitals Value Taken Time  BP 117/77 07/29/2018  7:36 PM  Temp 37.1 C 07/29/2018  7:35 PM  Pulse 89 07/29/2018  7:40 PM  Resp 20 07/29/2018  7:40 PM  SpO2 100 % 07/29/2018  7:40 PM  Vitals shown include unvalidated device data.  Last Pain:  Vitals:   07/29/18 1748  TempSrc: Temporal  PainSc:          Complications: No apparent anesthesia complications

## 2018-07-29 NOTE — Anesthesia Postprocedure Evaluation (Signed)
Anesthesia Post Note  Patient: Heidi Hudson  Procedure(s) Performed: HERNIA REPAIR UMBILICAL ADULT (N/A )  Patient location during evaluation: PACU Anesthesia Type: General Level of consciousness: awake and alert and oriented Pain management: pain level controlled Vital Signs Assessment: post-procedure vital signs reviewed and stable Respiratory status: spontaneous breathing, nonlabored ventilation and respiratory function stable Cardiovascular status: blood pressure returned to baseline and stable Postop Assessment: no signs of nausea or vomiting Anesthetic complications: no     Last Vitals:  Vitals:   07/29/18 1950 07/29/18 2005  BP: 117/77 115/81  Pulse: 89 71  Resp: 14 18  Temp:    SpO2: 97% 96%    Last Pain:  Vitals:   07/29/18 2005  TempSrc:   PainSc: 7                  Lasasha Brophy

## 2018-07-30 ENCOUNTER — Observation Stay: Payer: Medicaid Other

## 2018-07-30 ENCOUNTER — Encounter: Payer: Self-pay | Admitting: Surgery

## 2018-07-30 DIAGNOSIS — K420 Umbilical hernia with obstruction, without gangrene: Secondary | ICD-10-CM | POA: Diagnosis not present

## 2018-07-30 LAB — BASIC METABOLIC PANEL
Anion gap: 6 (ref 5–15)
BUN: 9 mg/dL (ref 6–20)
CO2: 27 mmol/L (ref 22–32)
Calcium: 8.9 mg/dL (ref 8.9–10.3)
Chloride: 107 mmol/L (ref 98–111)
Creatinine, Ser: 0.77 mg/dL (ref 0.44–1.00)
GFR calc Af Amer: >60 mL/min (ref >60)
GFR calc non Af Amer: >60 mL/min (ref >60)
Glucose, Bld: 140 mg/dL — ABNORMAL HIGH (ref 70–99)
Potassium: 4.6 mmol/L (ref 3.5–5.1)
Sodium: 140 mmol/L (ref 135–145)

## 2018-07-30 LAB — CBC
HCT: 41.7 % (ref 35.0–47.0)
Hemoglobin: 14.4 g/dL (ref 12.0–16.0)
MCH: 31.3 pg (ref 26.0–34.0)
MCHC: 34.5 g/dL (ref 32.0–36.0)
MCV: 90.7 fL (ref 80.0–100.0)
Platelets: 242 10*3/uL (ref 150–440)
RBC: 4.59 MIL/uL (ref 3.80–5.20)
RDW: 13.2 % (ref 11.5–14.5)
WBC: 10.1 10*3/uL (ref 3.6–11.0)

## 2018-07-30 LAB — MAGNESIUM: Magnesium: 2.2 mg/dL (ref 1.7–2.4)

## 2018-07-30 LAB — PHOSPHORUS: Phosphorus: 3.2 mg/dL (ref 2.5–4.6)

## 2018-07-30 MED ORDER — KETOROLAC TROMETHAMINE 30 MG/ML IJ SOLN
30.0000 mg | Freq: Four times a day (QID) | INTRAMUSCULAR | Status: DC | PRN
  Administered 2018-07-30 (×2): 30 mg via INTRAVENOUS
  Filled 2018-07-30 (×2): qty 1

## 2018-07-30 MED ORDER — ONDANSETRON 4 MG PO TBDP: 4.0000 mg | ORAL_TABLET | Freq: Four times a day (QID) | ORAL | 0 refills | Status: AC | PRN

## 2018-07-30 MED ORDER — CELECOXIB 200 MG PO CAPS
200.0000 mg | ORAL_CAPSULE | Freq: Every day | ORAL | Status: DC
  Administered 2018-07-30: 200 mg via ORAL
  Filled 2018-07-30: qty 1

## 2018-07-30 MED ORDER — HYDROMORPHONE HCL 1 MG/ML IJ SOLN
INTRAMUSCULAR | Status: AC
  Filled 2018-07-30: qty 1

## 2018-07-30 MED ORDER — GABAPENTIN 100 MG PO CAPS: 200.0000 mg | ORAL_CAPSULE | Freq: Two times a day (BID) | ORAL | 0 refills | Status: AC

## 2018-07-30 MED ORDER — CELECOXIB 200 MG PO CAPS: 200.0000 mg | ORAL_CAPSULE | Freq: Every day | ORAL | 0 refills | Status: AC

## 2018-07-30 MED ORDER — GABAPENTIN 100 MG PO CAPS
200.0000 mg | ORAL_CAPSULE | Freq: Two times a day (BID) | ORAL | Status: DC
  Administered 2018-07-30: 200 mg via ORAL
  Filled 2018-07-30: qty 2

## 2018-07-30 MED ORDER — HYDROMORPHONE HCL 1 MG/ML IJ SOLN
1.0000 mg | Freq: Once | INTRAMUSCULAR | Status: AC
  Administered 2018-07-30: 1 mg via INTRAVENOUS
  Filled 2018-07-30: qty 1

## 2018-07-30 MED ORDER — DOCUSATE SODIUM 100 MG PO CAPS: 100.0000 mg | ORAL_CAPSULE | Freq: Two times a day (BID) | ORAL | 0 refills | Status: AC | PRN

## 2018-07-30 NOTE — Progress Notes (Signed)
Heidi Hudson  A and O x 4. VSS. Pt tolerating diet well. No complaints of pain or nausea. IV removed intact, prescriptions given. Pt voiced understanding of discharge instructions with no further questions. Pt discharged home. Per pt request she wants to wait at the entrance for her ride.     Allergies as of 07/30/2018      Reactions   Latex Hives, Swelling   Hydrocodone Hives, Nausea And Vomiting      Medication List    STOP taking these medications   cephALEXin 500 MG capsule Commonly known as:  KEFLEX   omeprazole 20 MG tablet Commonly known as:  PRILOSEC OTC   promethazine 25 MG tablet Commonly known as:  PHENERGAN   sulfamethoxazole-trimethoprim 800-160 MG tablet Commonly known as:  BACTRIM DS     TAKE these medications   celecoxib 200 MG capsule Commonly known as:  CELEBREX Take 1 capsule (200 mg total) by mouth daily for 10 days. Start taking on:  07/31/2018   docusate sodium 100 MG capsule Commonly known as:  COLACE Take 1 capsule (100 mg total) by mouth 2 (two) times daily as needed for mild constipation.   gabapentin 100 MG capsule Commonly known as:  NEURONTIN Take 2 capsules (200 mg total) by mouth 2 (two) times daily for 10 days.   ondansetron 4 MG disintegrating tablet Commonly known as:  ZOFRAN-ODT Take 1 tablet (4 mg total) by mouth every 6 (six) hours as needed for nausea.       Vitals:   07/30/18 0434 07/30/18 1215  BP: 103/65 95/61  Pulse: 71 73  Resp: 17 16  Temp: 97.7 F (36.5 C) 98.1 F (36.7 C)  SpO2: 97% 100%    Heidi Hudson

## 2018-07-30 NOTE — Progress Notes (Signed)
Subjective:    Heidi Hudson is a 32 y.o. female  Hospital stay day 0, 1 Day Post-Op open primary repair of incarcerated umbilical hernia repair  Pt now states she is having more sharp pain that maybe different from the pain she had pre-op.  Distribution is still the same with pain in periumbilical region and RLQ.  Intensity remains unchanged.  Dilaudid did give some relief.  Feeling nauseated but ordered fast food when I was in the room.  Last BM yesterday, no issues.  No abnormal uterine bleeding issues reported.   Objective:      Temp:  [97.7 F (36.5 C)-98.7 F (37.1 C)] 97.7 F (36.5 C) (08/06 0434) Pulse Rate:  [62-103] 71 (08/06 0434) Resp:  [14-21] 17 (08/06 0434) BP: (92-119)/(56-87) 103/65 (08/06 0434) SpO2:  [94 %-100 %] 97 % (08/06 0434) Weight:  [73.5 kg (162 lb)] 73.5 kg (162 lb) (08/05 1744)     Height: 5\' 4"  (162.6 cm) Weight: 73.5 kg (162 lb) BMI (Calculated): 27.79   Intake/Output this shift:  Total I/O In: -  Out: 175 [Urine:175]       Constitutional :  alert, cooperative and appears stated age  Respiratory:  clear to auscultation bilaterally  Cardiovascular:  regular rate and rhythm, S1, S2 normal, no murmur, click, rub or gallop  Gastrointestinal: soft, but with focal guarding still present in periumbilical region and RLQ, now extending down into right inguinal/femoral region but palpable hernia or overlying skin changes.  .   Skin: Cool and moist. Periumbilical incision c/d/i  Psychiatric: Normal affect, non-agitated, not confused       LABS:  CMP Latest Ref Rng & Units 07/30/2018 07/21/2018 06/01/2018  Glucose 70 - 99 mg/dL 161(W140(H) 960(A106(H) 540(J126(H)  BUN 6 - 20 mg/dL 9 13 11   Creatinine 0.44 - 1.00 mg/dL 8.110.77 9.140.67 7.820.69  Sodium 135 - 145 mmol/L 140 140 136  Potassium 3.5 - 5.1 mmol/L 4.6 4.4 4.1  Chloride 98 - 111 mmol/L 107 107 105  CO2 22 - 32 mmol/L 27 27 23   Calcium 8.9 - 10.3 mg/dL 8.9 9.4 9.1  Total Protein 6.5 - 8.1 g/dL - 7.6 7.5  Total  Bilirubin 0.3 - 1.2 mg/dL - 0.6 0.5  Alkaline Phos 38 - 126 U/L - 65 67  AST 15 - 41 U/L - 22 21  ALT 0 - 44 U/L - 34 17   CBC Latest Ref Rng & Units 07/30/2018 07/29/2018 07/21/2018  WBC 3.6 - 11.0 K/uL 10.1 8.5 8.1  Hemoglobin 12.0 - 16.0 g/dL 95.614.4 21.314.0 08.615.5  Hematocrit 35.0 - 47.0 % 41.7 39.7 43.9  Platelets 150 - 440 K/uL 242 232 222    RADS: CLINICAL DATA:  Persistent RIGHT LOWER QUADRANT pain. Abnormal CT. Calcifications of the ovary. Status post umbilical hernia repair. LMP 07/19/2018.  EXAM: TRANSABDOMINAL AND TRANSVAGINAL ULTRASOUND OF PELVIS  TECHNIQUE: Both transabdominal and transvaginal ultrasound examinations of the pelvis were performed. Transabdominal technique was performed for global imaging of the pelvis including uterus, ovaries, adnexal regions, and pelvic cul-de-sac. It was necessary to proceed with endovaginal exam following the transabdominal exam to visualize the ovaries and endometrium.  COMPARISON:  CT of the abdomen and pelvis on 07/21/2018  FINDINGS: Uterus  Measurements: 10.2 x 4.9 x 8.1 centimeters. No fibroids or other mass visualized.  Endometrium  Thickness: 7 millimeters.  No focal abnormality visualized.  Right ovary  Measurements: 2.8 x 2.0 x 2.3 centimeters. Normal appearance/no adnexal mass.  Left ovary  Measurements: 3.5  x 1.6 x 2.2 centimeters. Normal appearance/no adnexal mass.  Other findings  No abnormal free fluid.  IMPRESSION: Normal pelvic ultrasound.   Electronically Signed   By: Norva Pavlov M.D.   On: 07/30/2018 10:58 Assessment:   S/p open umbilical hernia repair for excruciating periumbilical pain/RLQ pain with umbilical discharge and erythema noted at base of umbilicus concerning for incarcerated umbilical hernia.  Operative findings did indeed note a very small incarcerated, but non-strangulated umbilical hernia that was primarily fixed.  Pain unfortunately present pre-op still seems  to be present today.  Wbc normal, physical exam unremarkable except for pain, and pelvic US unremarkable as well.  Pt does have hx of repeated ED visits for RLQ pain in the past year, but she states this pain is different from previous experiences.  Unclear where source of pain is at this point, but very unlikely to be a surgical issue.  Will not pursue any diagnostic laparoscopy at this point, since there is no lab/imaging studies to support such procedure at this time.  Will continue with symptom management by switching to neurontin, celebrex.  Dilaudid for breakthrough pain. Despite being in excruciating pain, patient is asking if she can be discharged tomorrow and is having conversations in no apparent distress when she is talking to family members at bedside.  May consider CT angiogram for mesenteric ischemia, but only pertinent finding to support this is the pain out of proportion to exam. She does not fit typical patient demographics.Marland KitchenMarland Kitchen

## 2018-07-30 NOTE — Discharge Summary (Signed)
Physician Discharge Summary  Patient ID: Heidi AstersVictoria M Gradel MRN: [id number] DOB/AGE: 1986/09/24 32 y.o.  Admit date: 07/29/2018 Discharge date: 07/30/2018  Admission Diagnoses: incarcerated umbilical hernia  Discharge Diagnoses: same  Discharged Condition: good  Hospital Course: patient admitted for abdominal pain.  Noted to have incarcerated umbilical hernia, which was repaired.  POD#1, patient stated pain has not changed and initially stated she will like to stay one more day, but pain much improved through the day, with neurontin, celebrex, to the point patient is now requesting discharge home and followup as outpatient.  At time of discharge, pt tolerating diet, pain controlled, all questions addressed  Consults: None  Significant Diagnostic Studies:   Treatments: procedures: open umbilical hernia repair  Discharge Exam: Blood pressure 95/61, pulse 73, temperature 98.1 F (36.7 C), temperature source Oral, resp. rate 16, height 5\' 4"  (1.626 m), weight 73.5 kg (162 lb), last menstrual period 07/07/2018, SpO2 100 %, unknown if currently breastfeeding. General appearance: alert and cooperative umbilical incision c/d/i.  tenderness noted in RLQ and periumbilical region, tolerable.  Disposition: Discharge disposition: 01-Home or Self Care        Allergies as of 07/30/2018      Reactions   Latex Hives, Swelling   Hydrocodone Hives, Nausea And Vomiting      Medication List    STOP taking these medications   cephALEXin 500 MG capsule Commonly known as:  KEFLEX   omeprazole 20 MG tablet Commonly known as:  PRILOSEC OTC   promethazine 25 MG tablet Commonly known as:  PHENERGAN   sulfamethoxazole-trimethoprim 800-160 MG tablet Commonly known as:  BACTRIM DS     TAKE these medications   celecoxib 200 MG capsule Commonly known as:  CELEBREX Take 1 capsule (200 mg total) by mouth daily for 10 days. Start taking on:  07/31/2018   docusate sodium 100 MG capsule Commonly  known as:  COLACE Take 1 capsule (100 mg total) by mouth 2 (two) times daily as needed for mild constipation.   gabapentin 100 MG capsule Commonly known as:  NEURONTIN Take 2 capsules (200 mg total) by mouth 2 (two) times daily for 10 days.   ondansetron 4 MG disintegrating tablet Commonly known as:  ZOFRAN-ODT Take 1 tablet (4 mg total) by mouth every 6 (six) hours as needed for nausea.      Follow-up Information    Tonna BoehringerSakai, Latha Staunton, DO Follow up in 2 week(s).   Specialty:  Surgery Contact information: [address] [place] [id number] [phone]           Signed: Sung Amabilesami Kerri Kovacik 07/30/2018, 4:48 PM

## 2018-07-31 ENCOUNTER — Other Ambulatory Visit: Payer: Self-pay | Admitting: Surgery

## 2018-07-31 DIAGNOSIS — K551 Chronic vascular disorders of intestine: Secondary | ICD-10-CM

## 2018-07-31 LAB — HIV ANTIBODY (ROUTINE TESTING W REFLEX): HIV Screen 4th Generation wRfx: NONREACTIVE

## 2018-08-07 ENCOUNTER — Ambulatory Visit: Payer: Medicaid Other

## 2018-08-15 ENCOUNTER — Ambulatory Visit
Admission: RE | Admit: 2018-08-15 | Discharge: 2018-08-15 | Disposition: A | Discharge status: Home / Self Care | Payer: Medicaid Other | Source: Ambulatory Visit | Attending: Surgery | Admitting: Surgery

## 2018-08-15 DIAGNOSIS — K551 Chronic vascular disorders of intestine: Secondary | ICD-10-CM | POA: Insufficient documentation

## 2018-08-15 MED ORDER — IOPAMIDOL (ISOVUE-370) INJECTION 76%
100.0000 mL | Freq: Once | INTRAVENOUS | Status: AC | PRN
  Administered 2018-08-15: 100 mL via INTRAVENOUS

## 2019-07-14 IMAGING — US US ART/VEN ABD/PELV/SCROTUM DOPPLER LTD
1 series · 13 of 25 positions shown · non-contrast
Comparison: CT abdomen and pelvis 02/11/2016

CLINICAL DATA: Right-sided abdominal pain.  Vaginal bleeding.

EXAM:
TRANSABDOMINAL AND TRANSVAGINAL ULTRASOUND OF PELVIS
DOPPLER ULTRASOUND OF OVARIES
TECHNIQUE: Both transabdominal and transvaginal ultrasound examinations of the
pelvis were performed. Transabdominal technique was performed for
global imaging of the pelvis including uterus, ovaries, adnexal
regions, and pelvic cul-de-sac.
It was necessary to proceed with endovaginal exam following the
transabdominal exam to visualize the adnexa. Color and duplex
Doppler ultrasound was utilized to evaluate blood flow to the
ovaries.

[Series 1: us art/ven abd/pelv/scrotum doppler ltd · 13 of 139 slices shown]
[im 1/139]
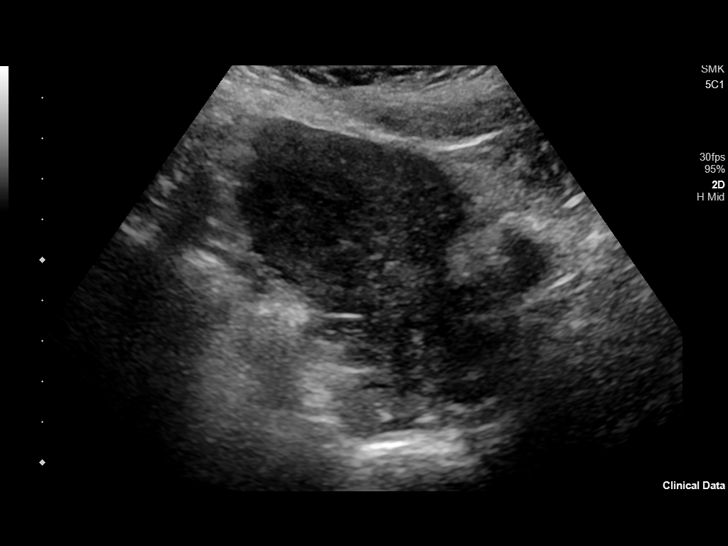
[im 12/139]
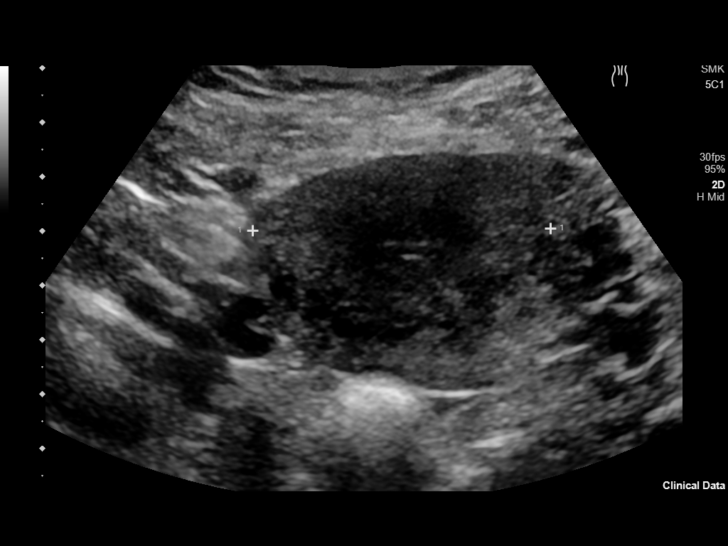
[im 24/139]
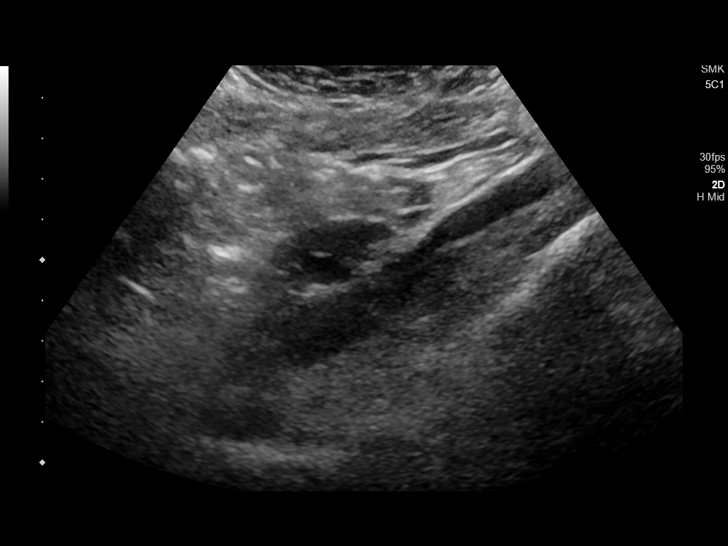
[im 35/139]
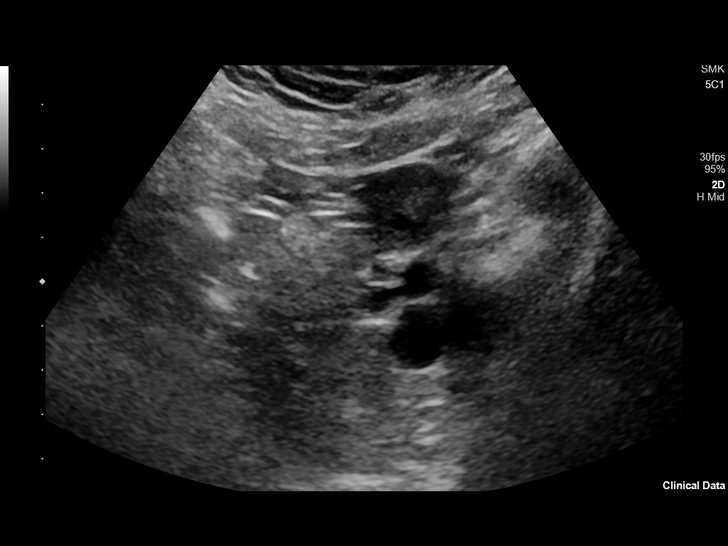
[im 47/139]
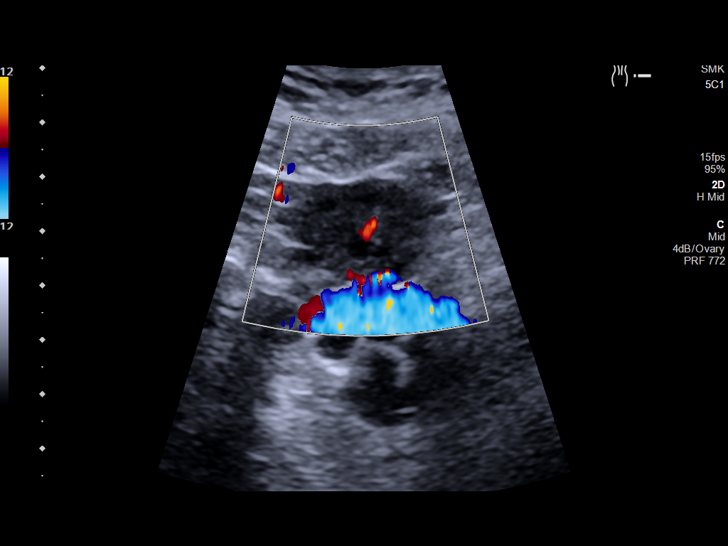
[im 58/139]
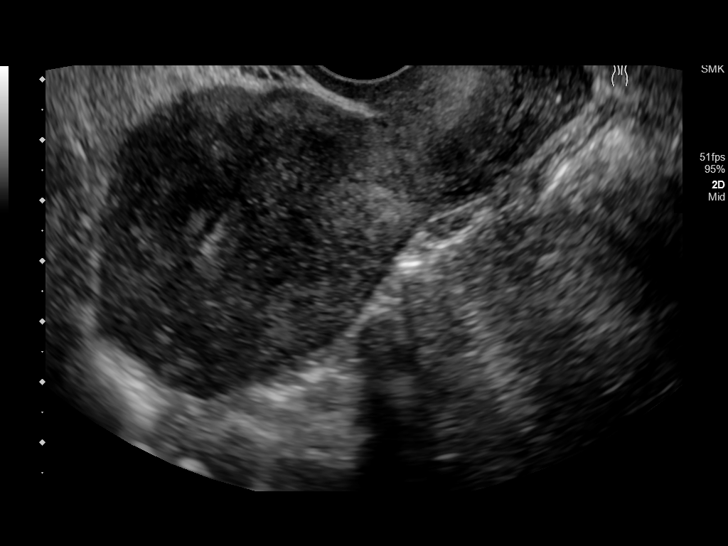
[im 70/139]
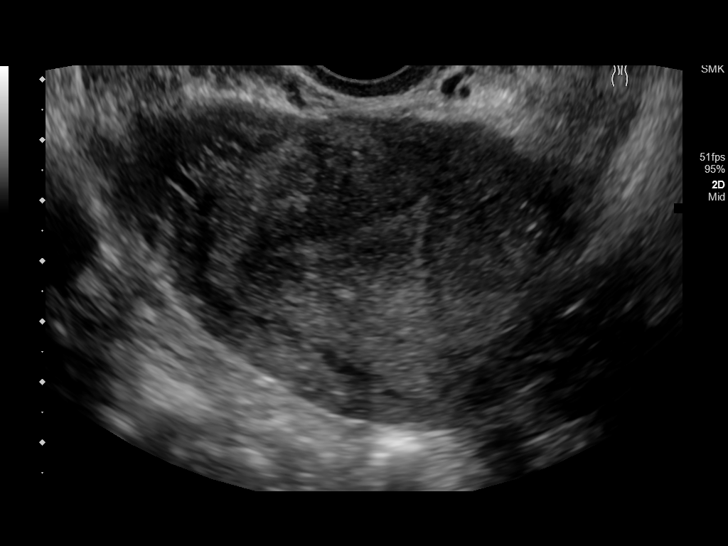
[im 81/139]
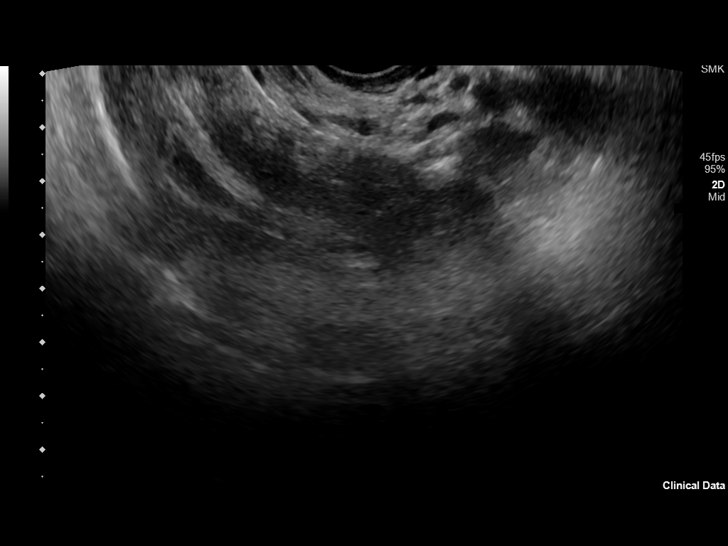
[im 93/139]
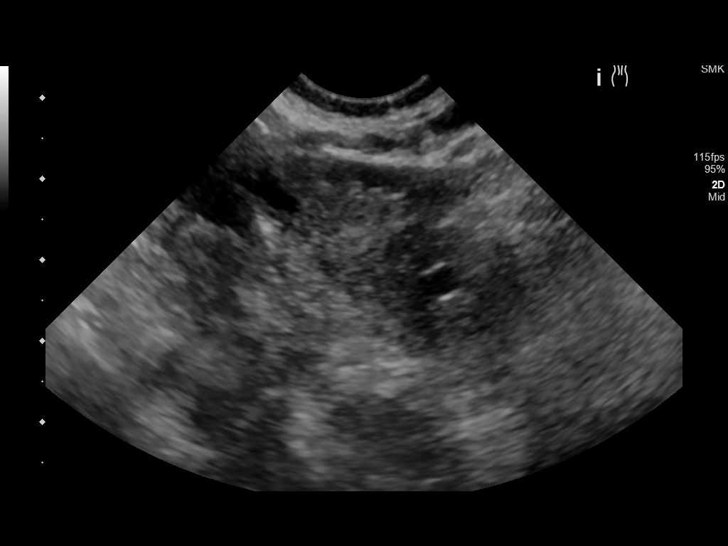
[im 104/139]
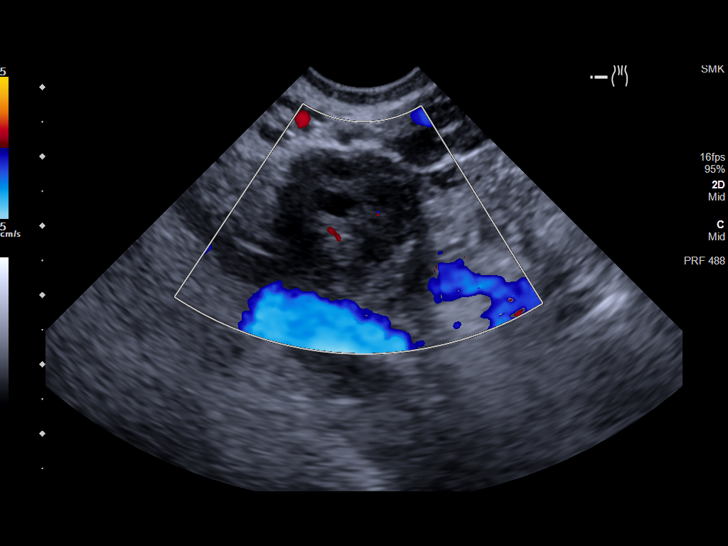
[im 116/139]
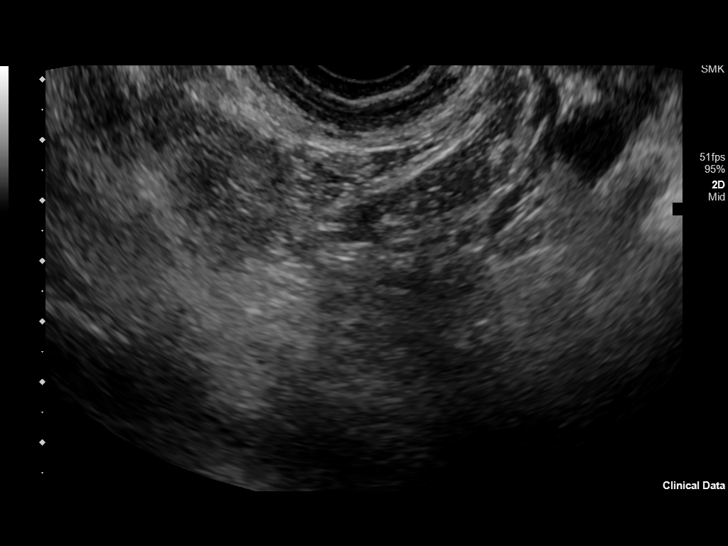
[im 127/139]
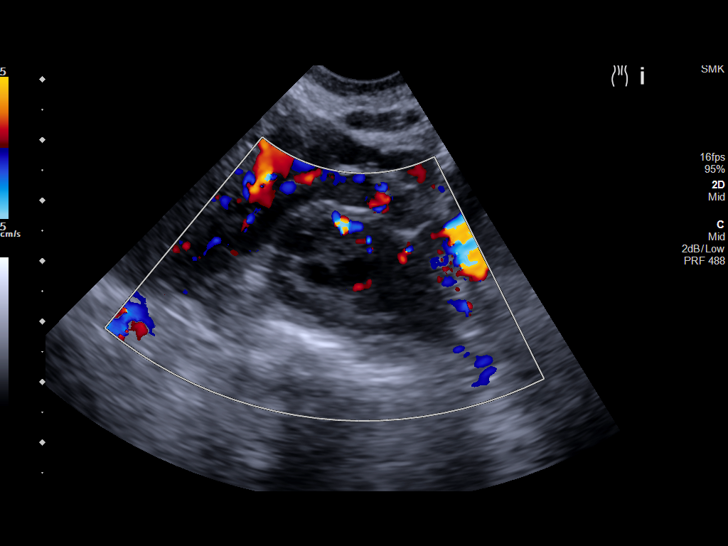
[im 139/139]
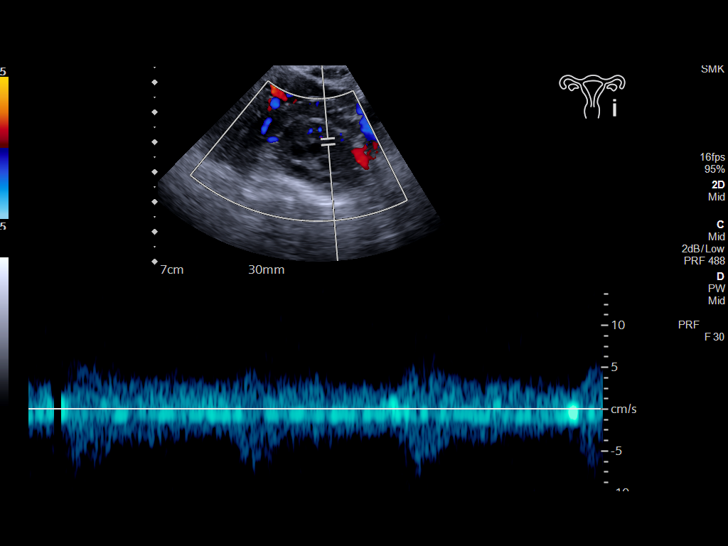

[13 of 25 positions shown; findings below may reference images not displayed]

FINDINGS: Uterus

Measurements: 9.5 x 4.4 x 5.7 cm, within normal limits. No fibroids
or other mass visualized. The both the and cysts are present in the
lower uterine segment and cervix.

Endometrium

Thickness: 4.8 mm, within normal limits. No focal abnormality
visualized.

Right ovary

Measurements: 3.4 x 2.2 x 2.0 cm, within normal limits. Normal
appearance/no adnexal mass.

Left ovary

Measurements: 3.3 x 2.0 x 2.5 cm, within normal limits. Normal
appearance/no adnexal mass.

Pulsed Doppler evaluation of both ovaries demonstrates normal
low-resistance arterial and venous waveforms.

Other findings

Trace free fluid is present.
IMPRESSION: Uterus and adnexa are within normal limits.
